# Patient Record
Sex: Male | Born: 1980
Health system: Southern US, Community
[De-identification: ages and names within clinical notes are randomized; demographics above are authoritative.]

---

## 2000-02-29 ENCOUNTER — Emergency Department (HOSPITAL_COMMUNITY): Admission: EM | Admit: 2000-02-29 | Discharge: 2000-02-29 | Payer: Self-pay | Admitting: Emergency Medicine

## 2000-10-03 ENCOUNTER — Encounter: Payer: Self-pay | Admitting: Emergency Medicine

## 2000-10-03 ENCOUNTER — Emergency Department (HOSPITAL_COMMUNITY): Admission: EM | Admit: 2000-10-03 | Discharge: 2000-10-03 | Payer: Self-pay | Admitting: Emergency Medicine

## 2008-08-20 ENCOUNTER — Emergency Department (HOSPITAL_COMMUNITY): Admission: EM | Admit: 2008-08-20 | Discharge: 2008-08-20 | Payer: Self-pay | Admitting: Emergency Medicine

## 2009-01-25 ENCOUNTER — Emergency Department (HOSPITAL_COMMUNITY): Admission: EM | Admit: 2009-01-25 | Discharge: 2009-01-25 | Payer: Self-pay | Admitting: Emergency Medicine

## 2010-01-29 ENCOUNTER — Emergency Department (HOSPITAL_COMMUNITY): Admission: EM | Admit: 2010-01-29 | Discharge: 2010-01-29 | Payer: Self-pay | Admitting: Emergency Medicine

## 2010-08-07 LAB — RPR: RPR Ser Ql: NONREACTIVE

## 2012-06-15 ENCOUNTER — Emergency Department (HOSPITAL_COMMUNITY)
Admission: EM | Admit: 2012-06-15 | Discharge: 2012-06-15 | Disposition: A | Discharge status: Home / Self Care | Payer: BC Managed Care – PPO | Attending: Emergency Medicine | Admitting: Emergency Medicine

## 2012-06-15 ENCOUNTER — Encounter (HOSPITAL_COMMUNITY): Payer: Self-pay | Admitting: Emergency Medicine

## 2012-06-15 DIAGNOSIS — R220 Localized swelling, mass and lump, head: Secondary | ICD-10-CM | POA: Insufficient documentation

## 2012-06-15 DIAGNOSIS — F17200 Nicotine dependence, unspecified, uncomplicated: Secondary | ICD-10-CM | POA: Insufficient documentation

## 2012-06-15 DIAGNOSIS — K089 Disorder of teeth and supporting structures, unspecified: Secondary | ICD-10-CM | POA: Insufficient documentation

## 2012-06-15 DIAGNOSIS — R221 Localized swelling, mass and lump, neck: Secondary | ICD-10-CM

## 2012-06-15 MED ORDER — CLINDAMYCIN HCL 150 MG PO CAPS: 300.0000 mg | ORAL_CAPSULE | Freq: Three times a day (TID) | ORAL | Status: DC

## 2012-06-15 MED ORDER — HYDROCODONE-ACETAMINOPHEN 5-500 MG PO TABS: 1.0000 | ORAL_TABLET | Freq: Four times a day (QID) | ORAL | Status: DC | PRN

## 2012-06-15 NOTE — ED Notes (Addendum)
Pt reports R jaw pain and swelling.  Denies any dental pain.  Pt states "i told her it's my gums."  No drainage noted.

## 2012-06-15 NOTE — ED Notes (Signed)
Pt states that he has a mass that has come up on his right jaw x 3 days ago.  States it is painful and has just gotten bigger.  States that he took amoxicillin that he had left over for the past 3 days.

## 2012-06-15 NOTE — ED Provider Notes (Signed)
History     CSN: [id number]  Arrival date & time 06/15/12  4098   First MD Initiated Contact with Patient 06/15/12 [phone]      Chief Complaint  Patient presents with  . Abscess    (Consider location/radiation/quality/duration/timing/severity/associated sxs/prior treatment) HPI Carlos Hatfield is a 32 y.o. male who presents to ED with complaint of pain and swelling to the right lower jaw. States started 2 days ago. States has bad teeth but they do not hurt. Has been taking amoxicillin for last 2 days, with no improvement. States took 1 tab 3 times a day. Has apt with dentist next month. No fever, chills, malaise.      History reviewed. No pertinent past medical history.  History reviewed. No pertinent past surgical history.  History reviewed. No pertinent family history.  History  Substance Use Topics  . Smoking status: Current Every Day Smoker -- 0.5 packs/day    Types: Cigarettes  . Smokeless tobacco: Not on file  . Alcohol Use: No      Review of Systems  Constitutional: Negative for fever and chills.  HENT: Positive for facial swelling and dental problem. Negative for congestion, sore throat, neck pain, neck stiffness and postnasal drip.   Respiratory: Negative.   Cardiovascular: Negative.   Gastrointestinal: Negative for vomiting.  Musculoskeletal: Negative for myalgias and arthralgias.  Neurological: Negative for headaches.    Allergies  Review of patient's allergies indicates no known allergies.  Home Medications  No current outpatient prescriptions on file.  BP 113/75  Pulse 98  Temp 99.3 F (37.4 C)  Resp 20  SpO2 100%  Physical Exam  Nursing note and vitals reviewed. Constitutional: He appears well-developed and well-nourished. No distress.  HENT:  Head: Normocephalic and atraumatic.  Right Ear: Tympanic membrane, external ear and ear canal normal.  Left Ear: Tympanic membrane, external ear and ear canal normal.  Nose: Nose normal.    Mouth/Throat: Uvula is midline, oropharynx is clear and moist and mucous membranes are normal. Abnormal dentition. Dental caries present. No dental abscesses or uvula swelling.       There is about 3x3cm swelling to the right submandibular area at the jaw line. Area is firm, round. Tender. No erythema.   Eyes: Conjunctivae normal are normal.  Neck: Neck supple.  Cardiovascular: Normal rate, regular rhythm and normal heart sounds.   Pulmonary/Chest: Effort normal and breath sounds normal. No respiratory distress. He has no wheezes. He has no rales.  Neurological: He is alert.  Skin: Skin is warm.    ED Course  Procedures (including critical care time)    1. Submandibular swelling       MDM  Pt with right facial mass/swelling. Differentials include dental abscess, submandibular glad stone, mass. Pt does have poor dentition, however no signs of an abscess on the dental exam. Abscess appears lower in the submandibular area. Pt has no signs of ludwig's angina or swelling under the tongue. He is in no distress. Will start on clindamycin, follow up with oral surgery and ENT.   Filed Vitals:   06/15/12 0905  BP: 113/75  Pulse: 98  Temp: 99.3 F (37.4 C)  Resp: [address] A Dannon Nguy[place]hi, [place] 06/15/12 1645

## 2012-06-16 NOTE — ED Provider Notes (Signed)
Medical screening examination/treatment/procedure(s) were performed by non-physician practitioner and as supervising physician I was immediately available for consultation/collaboration.   Gilda Crease, MD 06/16/12 [phone]

## 2012-09-01 ENCOUNTER — Emergency Department (INDEPENDENT_AMBULATORY_CARE_PROVIDER_SITE_OTHER)
Admission: EM | Admit: 2012-09-01 | Discharge: 2012-09-01 | Disposition: A | Discharge status: Home / Self Care | Payer: BC Managed Care – PPO | Source: Home / Self Care | Attending: Emergency Medicine | Admitting: Emergency Medicine

## 2012-09-01 ENCOUNTER — Emergency Department (INDEPENDENT_AMBULATORY_CARE_PROVIDER_SITE_OTHER): Payer: BC Managed Care – PPO

## 2012-09-01 ENCOUNTER — Encounter (HOSPITAL_COMMUNITY): Payer: Self-pay | Admitting: *Deleted

## 2012-09-01 DIAGNOSIS — IMO0002 Reserved for concepts with insufficient information to code with codable children: Secondary | ICD-10-CM

## 2012-09-01 DIAGNOSIS — S46911A Strain of unspecified muscle, fascia and tendon at shoulder and upper arm level, right arm, initial encounter: Secondary | ICD-10-CM

## 2012-09-01 MED ORDER — KETOROLAC TROMETHAMINE 60 MG/2ML IM SOLN
INTRAMUSCULAR | Status: AC
  Filled 2012-09-01: qty 2

## 2012-09-01 MED ORDER — CYCLOBENZAPRINE HCL 10 MG PO TABS: 10.0000 mg | ORAL_TABLET | Freq: Three times a day (TID) | ORAL | Status: DC | PRN

## 2012-09-01 MED ORDER — IBUPROFEN 600 MG PO TABS: 600.0000 mg | ORAL_TABLET | Freq: Four times a day (QID) | ORAL | Status: DC | PRN

## 2012-09-01 MED ORDER — KETOROLAC TROMETHAMINE 60 MG/2ML IM SOLN
60.0000 mg | Freq: Once | INTRAMUSCULAR | Status: AC
  Administered 2012-09-01: 60 mg via INTRAMUSCULAR

## 2012-09-01 MED ORDER — HYDROCODONE-ACETAMINOPHEN 5-325 MG PO TABS: 1.0000 | ORAL_TABLET | ORAL | Status: DC | PRN

## 2012-09-01 NOTE — ED Provider Notes (Signed)
History     CSN: [id number]  Arrival date & time 09/01/12  1723   None     Chief Complaint  Patient presents with  . Arm Pain    (Consider location/radiation/quality/duration/timing/severity/associated sxs/prior treatment) Patient is a 32 y.o. male presenting with arm pain.  Arm Pain  C/O RIGHT SHOULDER PAIN RADIATING TO THE ARM AND FOREARM X 1 WEEK COULD HAVE BEEN HURT LIFTING AT WORK  History reviewed. No pertinent past medical history.  History reviewed. No pertinent past surgical history.  History reviewed. No pertinent family history.  History  Substance Use Topics  . Smoking status: Current Every Day Smoker -- 0.50 packs/day    Types: Cigarettes  . Smokeless tobacco: Not on file  . Alcohol Use: Yes     Comment: once every 2 weeks      Review of Systems  Musculoskeletal:       C/O RIGHT SHOULDER PAIN RADIATING TO THE ARM  All other systems reviewed and are negative.    Allergies  Review of patient's allergies indicates no known allergies.  Home Medications   Current Outpatient Rx  Name  Route  Sig  Dispense  Refill  . clindamycin (CLEOCIN) 150 MG capsule   Oral   Take 2 capsules (300 mg total) by mouth 3 (three) times daily.   42 capsule   0   . cyclobenzaprine (FLEXERIL) 10 MG tablet   Oral   Take 1 tablet (10 mg total) by mouth 3 (three) times daily as needed for muscle spasms.   30 tablet   0   . HYDROcodone-acetaminophen (NORCO/VICODIN) 5-325 MG per tablet   Oral   Take 1 tablet by mouth every 4 (four) hours as needed for pain.   15 tablet   0   . HYDROcodone-acetaminophen (VICODIN) 5-500 MG per tablet   Oral   Take 1-2 tablets by mouth every 6 (six) hours as needed for pain.   15 tablet   0   . ibuprofen (ADVIL,MOTRIN) 600 MG tablet   Oral   Take 1 tablet (600 mg total) by mouth every 6 (six) hours as needed for pain.   30 tablet   0     BP 124/70  Pulse 63  Temp(Src) 98.6 F (37 C) (Oral)  Resp 16  SpO2  100%  Physical Exam  Constitutional: He appears well-nourished.  Neck: Normal range of motion. Neck supple.  Cardiovascular: Normal rate and regular rhythm.   Pulmonary/Chest: Effort normal and breath sounds normal.  Abdominal: Soft.  Musculoskeletal: He exhibits tenderness.  RIGHT SHOULDER=NO SWELLING OR DEFORMITY BUT TENDER ANTERIORLY; ROM LIMITED DUE TO PAIN REST OF UPPER EXTREMITY WITH NO SELLING OR TENDERNESS NO NEUROVASCULAR DEFICIT  Skin: Skin is warm, dry and intact.  Psychiatric: He has a normal mood and affect. His speech is normal and behavior is normal.    ED Course  Procedures (including critical care time)  Labs Reviewed - No data to display No results found.   1. Shoulder strain, right, initial encounter       MDM          Jani Files, MD 09/21/12 1059

## 2012-09-01 NOTE — ED Notes (Signed)
C/o severe pain in R arm onset 1 week ago. No known injury.  Pain has gotten progressively worse.  Tingling in R thumb and index finger and all the way up medial aspect of R arm.  Has pain in top of R shoulder.  Pain worse when wakes up.

## 2014-09-21 ENCOUNTER — Emergency Department (HOSPITAL_COMMUNITY): Payer: BLUE CROSS/BLUE SHIELD

## 2014-09-21 ENCOUNTER — Emergency Department (HOSPITAL_COMMUNITY)
Admission: EM | Admit: 2014-09-21 | Discharge: 2014-09-21 | Disposition: A | Discharge status: Home / Self Care | Payer: BLUE CROSS/BLUE SHIELD | Attending: Emergency Medicine | Admitting: Emergency Medicine

## 2014-09-21 ENCOUNTER — Encounter (HOSPITAL_COMMUNITY): Payer: Self-pay | Admitting: Family Medicine

## 2014-09-21 DIAGNOSIS — R05 Cough: Secondary | ICD-10-CM | POA: Diagnosis present

## 2014-09-21 DIAGNOSIS — Z792 Long term (current) use of antibiotics: Secondary | ICD-10-CM | POA: Diagnosis not present

## 2014-09-21 DIAGNOSIS — R079 Chest pain, unspecified: Secondary | ICD-10-CM | POA: Diagnosis not present

## 2014-09-21 DIAGNOSIS — Z720 Tobacco use: Secondary | ICD-10-CM | POA: Diagnosis not present

## 2014-09-21 DIAGNOSIS — R059 Cough, unspecified: Secondary | ICD-10-CM

## 2014-09-21 DIAGNOSIS — R0602 Shortness of breath: Secondary | ICD-10-CM

## 2014-09-21 MED ORDER — TRAMADOL HCL 50 MG PO TABS: 50.0000 mg | ORAL_TABLET | Freq: Four times a day (QID) | ORAL | Status: DC | PRN

## 2014-09-21 MED ORDER — KETOROLAC TROMETHAMINE 30 MG/ML IJ SOLN
30.0000 mg | Freq: Once | INTRAMUSCULAR | Status: AC
  Administered 2014-09-21: 30 mg via INTRAVENOUS
  Filled 2014-09-21: qty 1

## 2014-09-21 MED ORDER — ALBUTEROL SULFATE HFA 108 (90 BASE) MCG/ACT IN AERS: 1.0000 | INHALATION_SPRAY | Freq: Four times a day (QID) | RESPIRATORY_TRACT | Status: AC | PRN

## 2014-09-21 MED ORDER — KETOROLAC TROMETHAMINE 30 MG/ML IM SOLN: 30.0000 mg | Freq: Once | INTRAMUSCULAR | Status: DC

## 2014-09-21 MED ORDER — PREDNISONE 10 MG PO TABS: ORAL_TABLET | ORAL | Status: DC

## 2014-09-21 NOTE — Discharge Instructions (Signed)
Cough, Adult  A cough is a reflex. It helps you clear your throat and airways. A cough can help heal your body. A cough can last 2 or 3 weeks (acute) or may last more than 8 weeks (chronic). Some common causes of a cough can include an infection, allergy, or a cold. HOME CARE  Only take medicine as told by your doctor.  If given, take your medicines (antibiotics) as told. Finish them even if you start to feel better.  Use a cold steam vaporizer or humidifier in your home. This can help loosen thick spit (secretions).  Sleep so you are almost sitting up (semi-upright). Use pillows to do this. This helps reduce coughing.  Rest as needed.  Stop smoking if you smoke. GET HELP RIGHT AWAY IF:  You have yellowish-white fluid (pus) in your thick spit.  Your cough gets worse.  Your medicine does not reduce coughing, and you are losing sleep.  You cough up blood.  You have trouble breathing.  Your pain gets worse and medicine does not help.  You have a fever. MAKE SURE YOU:   Understand these instructions.  Will watch your condition.  Will get help right away if you are not doing well or get worse. Document Released: 01/22/2011 Document Revised: 09/25/2013 Document Reviewed: 01/22/2011 [facility] Patient Information 2015 [place], [place]. This information is not intended to replace advice given to you by your health care provider. Make sure you discuss any questions you have with your health care provider. Chest Wall Pain Chest wall pain is pain in or around the bones and muscles of your chest. It may take up to 6 weeks to get better. It may take longer if you must stay physically active in your work and activities.  CAUSES  Chest wall pain may happen on its own. However, it may be caused by:  A viral illness like the flu.  Injury.  Coughing.  Exercise.  Arthritis.  Fibromyalgia.  Shingles. HOME CARE INSTRUCTIONS   Avoid overtiring physical activity. Try not to strain or  perform activities that cause pain. This includes any activities using your chest or your abdominal and side muscles, especially if heavy weights are used.  Put ice on the sore area.  Put ice in a plastic bag.  Place a towel between your skin and the bag.  Leave the ice on for 15-20 minutes per hour while awake for the first 2 days.  Only take over-the-counter or prescription medicines for pain, discomfort, or fever as directed by your caregiver. SEEK IMMEDIATE MEDICAL CARE IF:   Your pain increases, or you are very uncomfortable.  You have a fever.  Your chest pain becomes worse.  You have new, unexplained symptoms.  You have nausea or vomiting.  You feel sweaty or lightheaded.  You have a cough with phlegm (sputum), or you cough up blood. MAKE SURE YOU:   Understand these instructions.  Will watch your condition.  Will get help right away if you are not doing well or get worse. Document Released: 05/11/2005 Document Revised: 08/03/2011 Document Reviewed: 01/05/2011 [facility] Patient Information 2015 [place], [place]. This information is not intended to replace advice given to you by your health care provider. Make sure you discuss any questions you have with your health care provider.

## 2014-09-21 NOTE — ED Notes (Signed)
Pt here for cough and right sided chest pain with coughing x 2 months. sts has been treated with abx and no relief.

## 2014-09-21 NOTE — ED Provider Notes (Signed)
CSN: [id number]     Arrival date & time 09/21/14  1103 History   First MD Initiated Contact with Patient 09/21/14 1147     Chief Complaint  Patient presents with  . Cough  . Chest Pain     (Consider location/radiation/quality/duration/timing/severity/associated sxs/prior Treatment) Patient is a 34 y.o. male presenting with cough. The history is provided by the patient. No language interpreter was used.  Cough Cough characteristics:  Non-productive Severity:  Moderate Onset quality:  Gradual Duration:  8 weeks Timing:  Constant Progression:  Worsening Chronicity:  New Smoker: no   Context: not sick contacts   Relieved by:  Nothing Worsened by:  Nothing tried Ineffective treatments:  None tried Associated symptoms: chest pain   Associated symptoms: no shortness of breath   Pt is worried that he coughed so hard that he broke a rib.    History reviewed. No pertinent past medical history. History reviewed. No pertinent past surgical history. History reviewed. No pertinent family history. History  Substance Use Topics  . Smoking status: Current Every Day Smoker -- 0.50 packs/day    Types: Cigarettes  . Smokeless tobacco: Not on file  . Alcohol Use: Yes     Comment: once every 2 weeks    Review of Systems  Respiratory: Positive for cough. Negative for shortness of breath.   Cardiovascular: Positive for chest pain.  All other systems reviewed and are negative.     Allergies  Review of patient's allergies indicates no known allergies.  Home Medications   Prior to Admission medications   Medication Sig Start Date End Date Taking? Authorizing Provider  clindamycin (CLEOCIN) 150 MG capsule Take 2 capsules (300 mg total) by mouth 3 (three) times daily. 06/15/12   Tatyana Kirichenko, PA-C  cyclobenzaprine (FLEXERIL) 10 MG tablet Take 1 tablet (10 mg total) by mouth 3 (three) times daily as needed for muscle spasms. 09/01/12   Jani Files, MD   HYDROcodone-acetaminophen (NORCO/VICODIN) 5-325 MG per tablet Take 1 tablet by mouth every 4 (four) hours as needed for pain. 09/01/12   Jani Files, MD  HYDROcodone-acetaminophen (VICODIN) 5-500 MG per tablet Take 1-2 tablets by mouth every 6 (six) hours as needed for pain. 06/15/12   Tatyana Kirichenko, PA-C  ibuprofen (ADVIL,MOTRIN) 600 MG tablet Take 1 tablet (600 mg total) by mouth every 6 (six) hours as needed for pain. 09/01/12   Duwayne Heck de [place], MD   BP 109/58 mmHg  Pulse 74  Temp(Src) 97.7 F (36.5 C)  Resp 17  Ht  (1.93 m)  Wt 185 lb (83.915 kg)  BMI 22.53 kg/m2  SpO2 96% Physical Exam  Constitutional: He appears well-developed and well-nourished.  HENT:  Head: Normocephalic.  Right Ear: External ear normal.  Left Ear: External ear normal.  Nose: Nose normal.  Mouth/Throat: Oropharynx is clear and moist.  Eyes: Conjunctivae are normal. Pupils are equal, round, and reactive to light.  Neck: Normal range of motion. Neck supple.  Cardiovascular: Normal rate.   Pulmonary/Chest: Effort normal.  Abdominal: Soft.  Musculoskeletal: Normal range of motion.  Neurological: He is alert.  Skin: Skin is warm.  Psychiatric: He has a normal mood and affect.  Nursing note and vitals reviewed.   ED Course  Procedures (including critical care time) Labs Review Labs Reviewed - No data to display  Imaging Review Dg Ribs Unilateral W/chest Right  09/21/2014   CLINICAL DATA:  Cough for 2 months. Right anterior and mid chest  pain.  EXAM: RIGHT RIBS AND CHEST - 3+ VIEW  COMPARISON:  None.  FINDINGS: No fracture or other bone lesions are seen involving the ribs. There is no evidence of pneumothorax or pleural effusion. Both lungs are clear. Heart size and mediastinal contours are within normal limits.  IMPRESSION: Negative.   Electronically Signed   By: Charlett NoseKevin  Dover M.D.   On: 09/21/2014 12:06     EKG Interpretation   Date/Time:  Friday September 21 2014 11:29:29  EDT Ventricular Rate:  56 PR Interval:  146 QRS Duration: 99 QT Interval:  389 QTC Calculation: 375 R Axis:   96 Text Interpretation:  Sinus rhythm Borderline right axis deviation ST  elev, probable normal early repol pattern Baseline wander in lead(s) V3 V4  V5 V6 No old tracing to compare Confirmed by [facility] CenterWENTZ  MD, ELLIOTT [phone] on  09/21/2014 11:50:25 AM      MDM  Pt has been on antibiotics x 2 Pt counseled on cough.   Pt is given rx for prednisone, albuterol and tramadol.   Pt is advised to follow up with his MD for recheck.   Final diagnoses:  Cough        Lonia SkinnerLeslie K ShanksvilleSofia, PA-C 09/21/14 1458  Mancel BaleElliott Wentz, MD 09/21/14 1620

## 2015-05-14 ENCOUNTER — Emergency Department (INDEPENDENT_AMBULATORY_CARE_PROVIDER_SITE_OTHER)
Admission: EM | Admit: 2015-05-14 | Discharge: 2015-05-14 | Disposition: A | Discharge status: Home / Self Care | Payer: BLUE CROSS/BLUE SHIELD | Source: Home / Self Care | Attending: Family Medicine | Admitting: Family Medicine

## 2015-05-14 ENCOUNTER — Encounter (HOSPITAL_COMMUNITY): Payer: Self-pay | Admitting: *Deleted

## 2015-05-14 DIAGNOSIS — M67972 Unspecified disorder of synovium and tendon, left ankle and foot: Secondary | ICD-10-CM

## 2015-05-14 NOTE — Discharge Instructions (Signed)
Wear boot at all times except bed, see orthopedist as soon as possible

## 2015-05-14 NOTE — ED Provider Notes (Signed)
CSN: [id number]     Arrival date & time 05/14/15  1819 History   First MD Initiated Contact with Patient 05/14/15 1935     Chief Complaint  Patient presents with  . Ankle Pain   (Consider location/radiation/quality/duration/timing/severity/associated sxs/prior Treatment) Patient is a 34 y.o. male presenting with ankle pain. The history is provided by the patient.  Ankle Pain Location:  Ankle Time since incident:  1 month Injury: yes   Mechanism of injury comment:  Ankle stepped on with cleat playing football over Thanksgiving, continues tender and swollen. Ankle location:  L ankle Pain details:    Quality:  Sharp   Severity:  Moderate   Onset quality:  Sudden   History reviewed. No pertinent past medical history. History reviewed. No pertinent past surgical history. History reviewed. No pertinent family history. Social History  Substance Use Topics  . Smoking status: Current Every Day Smoker -- 0.50 packs/day    Types: Cigarettes  . Smokeless tobacco: None  . Alcohol Use: Yes     Comment: once every 2 weeks    Review of Systems  Constitutional: Negative.   Musculoskeletal: Positive for joint swelling and gait problem.  Skin: Negative.   All other systems reviewed and are negative.   Allergies  Review of patient's allergies indicates no known allergies.  Home Medications   Prior to Admission medications   Medication Sig Start Date End Date Taking? Authorizing Provider  albuterol (PROVENTIL HFA;VENTOLIN HFA) 108 (90 BASE) MCG/ACT inhaler Inhale 1-2 puffs into the lungs every 6 (six) hours as needed for wheezing or shortness of breath. 09/21/14   Elson Areas, PA-C  clindamycin (CLEOCIN) 150 MG capsule Take 2 capsules (300 mg total) by mouth 3 (three) times daily. Patient not taking: Reported on 09/21/2014 06/15/12   Tatyana Kirichenko, PA-C  cyclobenzaprine (FLEXERIL) 10 MG tablet Take 1 tablet (10 mg total) by mouth 3 (three) times daily as needed for muscle  spasms. Patient not taking: Reported on 09/21/2014 09/01/12   Duwayne Heck de Marcello Moores, MD  HYDROcodone-acetaminophen (NORCO/VICODIN) 5-325 MG per tablet Take 1 tablet by mouth every 4 (four) hours as needed for pain. Patient not taking: Reported on 09/21/2014 09/01/12   Duwayne Heck de Marcello Moores, MD  HYDROcodone-acetaminophen (VICODIN) 5-500 MG per tablet Take 1-2 tablets by mouth every 6 (six) hours as needed for pain. Patient not taking: Reported on 09/21/2014 06/15/12   Tatyana Kirichenko, PA-C  ibuprofen (ADVIL,MOTRIN) 200 MG tablet Take 800 mg by mouth every 6 (six) hours as needed for mild pain or moderate pain.    Historical Provider, MD  ibuprofen (ADVIL,MOTRIN) 600 MG tablet Take 1 tablet (600 mg total) by mouth every 6 (six) hours as needed for pain. Patient not taking: Reported on 09/21/2014 09/01/12   Duwayne Heck de [place], MD  predniSONE (DELTASONE) 10 MG tablet [phone] taper 09/21/14   Elson Areas, PA-C  traMADol (ULTRAM) 50 MG tablet Take 1 tablet (50 mg total) by mouth every 6 (six) hours as needed. 09/21/14   Elson Areas, PA-C   Meds Ordered and Administered this Visit  Medications - No data to display  BP 115/78 mmHg  Pulse 80  Temp(Src) 98.2 F (36.8 C) (Oral)  Resp 14  SpO2 98% No data found.   Physical Exam  Constitutional: He is oriented to person, place, and time. He appears well-developed and well-nourished.  Musculoskeletal: He exhibits tenderness.       Left ankle: Achilles tendon exhibits pain. Achilles tendon  exhibits no defect and normal Thompson's test results.       Feet:  Neurological: He is alert and oriented to person, place, and time.  Skin: Skin is warm and dry.  Nursing note and vitals reviewed.   ED Course  Procedures (including critical care time)  Labs Review Labs Reviewed - No data to display  Imaging Review No results found.   Visual Acuity Review  Right Eye Distance:   Left Eye Distance:   Bilateral Distance:    Right Eye Near:    Left Eye Near:    Bilateral Near:         MDM   1. Achilles tendon disorder, left        Linna HoffJames D Genette Huertas, MD 05/14/15 2006

## 2015-05-14 NOTE — ED Notes (Signed)
Pt  Reports    l   Ankle   -     The        Pt  Has  Some  Knots  In  The  Heel of  The  l  Foot     He  States          He     Has      A  Knot  On the  Back  Of      His  Achillis  Heel        He  Reports             He  Injured  The   Heel   3  Weeks  Ago  whils  Playing  Sports  He  Reports  Pain on  Weight  Bearing

## 2016-10-12 ENCOUNTER — Emergency Department (HOSPITAL_COMMUNITY)
Admission: EM | Admit: 2016-10-12 | Discharge: 2016-10-13 | Disposition: A | Discharge status: Home / Self Care | Payer: BLUE CROSS/BLUE SHIELD | Attending: Emergency Medicine | Admitting: Emergency Medicine

## 2016-10-12 ENCOUNTER — Encounter (HOSPITAL_COMMUNITY): Payer: Self-pay | Admitting: Emergency Medicine

## 2016-10-12 ENCOUNTER — Emergency Department (HOSPITAL_COMMUNITY): Payer: BLUE CROSS/BLUE SHIELD

## 2016-10-12 DIAGNOSIS — M542 Cervicalgia: Secondary | ICD-10-CM | POA: Diagnosis not present

## 2016-10-12 DIAGNOSIS — K0889 Other specified disorders of teeth and supporting structures: Secondary | ICD-10-CM | POA: Diagnosis present

## 2016-10-12 DIAGNOSIS — F17210 Nicotine dependence, cigarettes, uncomplicated: Secondary | ICD-10-CM | POA: Diagnosis not present

## 2016-10-12 DIAGNOSIS — Z79899 Other long term (current) drug therapy: Secondary | ICD-10-CM | POA: Diagnosis not present

## 2016-10-12 DIAGNOSIS — K029 Dental caries, unspecified: Secondary | ICD-10-CM | POA: Diagnosis not present

## 2016-10-12 DIAGNOSIS — K047 Periapical abscess without sinus: Secondary | ICD-10-CM

## 2016-10-12 LAB — CBC
HCT: 41.3 % (ref 39.0–52.0)
HEMOGLOBIN: 13.8 g/dL (ref 13.0–17.0)
MCH: 31.4 pg (ref 26.0–34.0)
MCHC: 33.4 g/dL (ref 30.0–36.0)
MCV: 94.1 fL (ref 78.0–100.0)
Platelets: 287 10*3/uL (ref 150–400)
RBC: 4.39 MIL/uL (ref 4.22–5.81)
RDW: 13.3 % (ref 11.5–15.5)
WBC: 8.4 10*3/uL (ref 4.0–10.5)

## 2016-10-12 LAB — BASIC METABOLIC PANEL
ANION GAP: 7 (ref 5–15)
BUN: 9 mg/dL (ref 6–20)
CHLORIDE: 106 mmol/L (ref 101–111)
CO2: 26 mmol/L (ref 22–32)
Calcium: 9.3 mg/dL (ref 8.9–10.3)
Creatinine, Ser: 0.99 mg/dL (ref 0.61–1.24)
GFR calc non Af Amer: >60 mL/min (ref >60)
Glucose, Bld: 94 mg/dL (ref 65–99)
Potassium: 3.7 mmol/L (ref 3.5–5.1)
Sodium: 139 mmol/L (ref 135–145)

## 2016-10-12 LAB — I-STAT TROPONIN, ED: TROPONIN I, POC: 0 ng/mL (ref 0.00–0.08)

## 2016-10-12 MED ORDER — METHOCARBAMOL 500 MG PO TABS
750.0000 mg | ORAL_TABLET | Freq: Once | ORAL | Status: AC
  Administered 2016-10-13: 750 mg via ORAL
  Filled 2016-10-12: qty 2

## 2016-10-12 MED ORDER — KETOROLAC TROMETHAMINE 60 MG/2ML IM SOLN
30.0000 mg | Freq: Once | INTRAMUSCULAR | Status: AC
  Administered 2016-10-13: 30 mg via INTRAMUSCULAR
  Filled 2016-10-12: qty 2

## 2016-10-12 NOTE — ED Provider Notes (Signed)
[facility]-EMERGENCY DEPT Provider Note   CSN: [id number] Arrival date & time: 10/12/16  1828     History   Chief Complaint Chief Complaint  Patient presents with  . Chest Pain  . Dental Pain    HPI Carlos Hatfield is a 36 y.o. male.  HPI Carlos Hatfield is a 36 y.o. male presents to emergency department complaining of dental pain and neck pain radiating to the chest. Patient with no prior medical problems. He reports pain in the left lower back tooth for approximately a week. He states it has gotten worse the last 2 days. He states pain radiates into his neck and into the left upper chest. States neck is painful to move. He also reports tingling sensation in left hand. He has been taking ibuprofen for pain with no relief. He denies any prior neck injuries. He denies any similar symptoms in the past. He does have a dentist but has not made an appointment with them yet. Denies any fever or chills. No facial or neck swelling. No shortness of breath.  History reviewed. No pertinent past medical history.  There are no active problems to display for this patient.   History reviewed. No pertinent surgical history.     Home Medications    Prior to Admission medications   Medication Sig Start Date End Date Taking? Authorizing Provider  albuterol (PROVENTIL HFA;VENTOLIN HFA) 108 (90 BASE) MCG/ACT inhaler Inhale 1-2 puffs into the lungs every 6 (six) hours as needed for wheezing or shortness of breath. 09/21/14   Elson AreasSofia, Leslie K, PA-C  clindamycin (CLEOCIN) 150 MG capsule Take 2 capsules (300 mg total) by mouth 3 (three) times daily. Patient not taking: Reported on 09/21/2014 06/15/12   Jaynie CrumbleKirichenko, Coralee Edberg, PA-C  cyclobenzaprine (FLEXERIL) 10 MG tablet Take 1 tablet (10 mg total) by mouth 3 (three) times daily as needed for muscle spasms. Patient not taking: Reported on 09/21/2014 09/01/12   [place], Duwayne Heckeuben M, MD  HYDROcodone-acetaminophen (NORCO/VICODIN) 5-325 MG per tablet Take 1 tablet by  mouth every 4 (four) hours as needed for pain. Patient not taking: Reported on 09/21/2014 09/01/12   [place], Duwayne Heckeuben M, MD  HYDROcodone-acetaminophen (VICODIN) 5-500 MG per tablet Take 1-2 tablets by mouth every 6 (six) hours as needed for pain. Patient not taking: Reported on 09/21/2014 06/15/12   Jaynie CrumbleKirichenko, Staci Carver, PA-C  ibuprofen (ADVIL,MOTRIN) 200 MG tablet Take 800 mg by mouth every 6 (six) hours as needed for mild pain or moderate pain.    [provider]  ibuprofen (ADVIL,MOTRIN) 600 MG tablet Take 1 tablet (600 mg total) by mouth every 6 (six) hours as needed for pain. Patient not taking: Reported on 09/21/2014 09/01/12   [address]Las [address] ShoalsAl[place], Duwayne Heckeuben M, MD  predniSONE (DELTASONE) 10 MG tablet 4,0,9,8,1,16,5,4,3,2,1 taper 09/21/14   Elson AreasSofia, Leslie K, PA-C  traMADol (ULTRAM) 50 MG tablet Take 1 tablet (50 mg total) by mouth every 6 (six) hours as needed. 09/21/14   Elson AreasSofia, Leslie K, PA-C    Family History History reviewed. No pertinent family history.  Social History Social History  Substance Use Topics  . Smoking status: Current Every Day Smoker    Packs/day: 0.50    Types: Cigarettes  . Smokeless tobacco: Not on file  . Alcohol use Yes     Comment: once every 2 weeks     Allergies   Patient has no known allergies.   Review of Systems Review of Systems  Constitutional: Negative for chills and fever.  HENT: Positive  for dental problem. Negative for sore throat and trouble swallowing.   Respiratory: Positive for chest tightness. Negative for cough and shortness of breath.   Cardiovascular: Positive for chest pain. Negative for palpitations and leg swelling.  Gastrointestinal: Negative for abdominal distention, abdominal pain, diarrhea, nausea and vomiting.  Genitourinary: Negative for dysuria, frequency, hematuria and urgency.  Musculoskeletal: Positive for neck pain and neck stiffness. Negative for arthralgias and myalgias.  Skin: Negative for rash.  Allergic/Immunologic: Negative for  immunocompromised state.  Neurological: Negative for dizziness, weakness, light-headedness, numbness and headaches.  All other systems reviewed and are negative.    Physical Exam Updated Vital Signs BP 122/90 (BP Location: Left Arm) Comment: Simultaneous filing. User may not have seen previous data.  Pulse (!) 50   Temp 98.3 F (36.8 C) (Oral)   Resp (!) 9   Ht 6\' 4"  (1.93 m)   Wt 79.4 kg (175 lb)   SpO2 97%   BMI 21.30 kg/m   Physical Exam  Constitutional: He is oriented to person, place, and time. He appears well-developed and well-nourished. No distress.  HENT:  Head: Normocephalic and atraumatic.  Left lower second molar with large cavity. No significant surrounding gum swelling. No definite abscess. No facial swelling. No trismus. No swelling under the tongue.  Eyes: Conjunctivae are normal.  Neck: Neck supple.  Tenderness to palpation over left trapezius muscle and sternocleidomastoid. Pain with range of motion of the neck. No tenderness over the right side. No midline tenderness.  Cardiovascular: Normal rate, regular rhythm and normal heart sounds.   Pulmonary/Chest: Effort normal. No respiratory distress. He has no wheezes. He has no rales. He exhibits no tenderness.  Abdominal: Soft. Bowel sounds are normal. He exhibits no distension. There is no tenderness. There is no rebound.  Musculoskeletal: He exhibits no edema.  Full range of motion of the left arm, left grip strength is 5 over 5 and equal bilaterally. Distal radial pulses intact. Sensation intact in her left extremity in all dermatomes. Patient is able to do "thumbs up," spread all his fingers, across his second and third fingers.  Neurological: He is alert and oriented to person, place, and time.  Skin: Skin is warm and dry.  Nursing note and vitals reviewed.    ED Treatments / Results  Labs (all labs ordered are listed, but only abnormal results are displayed) Labs Reviewed  BASIC METABOLIC PANEL  CBC    I-STAT TROPOININ, ED    EKG  EKG Interpretation  Date/Time:  Monday Oct 12 2016 19:22:24 EDT Ventricular Rate:  53 PR Interval:  144 QRS Duration: 98 QT Interval:  400 QTC Calculation: 375 R Axis:   91 Text Interpretation:  Sinus bradycardia Rightward axis ST elevation, consider early repolarization, pericarditis, or injury Abnormal ECG No significant change since last tracing Confirmed by Lincoln Brigham [phone]) on 10/12/2016 11:11:16 PM       Radiology Dg Chest 2 View  Result Date: 10/12/2016 CLINICAL DATA:  Chest pain EXAM: CHEST  2 VIEW COMPARISON:  09/21/2014 FINDINGS: There are 2 nodular densities in the projection of both upper lobes which were not identified on the previous exam. Normal heart size. No pleural effusion or edema. No airspace opacities. IMPRESSION: 1. No acute cardiopulmonary abnormalities. 2. There are 2 nodular densities overlying both upper lobes which may be external to the patient, perhaps representing buttons/snaps. Recommend removal of any external clothing which may be creating artifact and repeat chest radiograph. These results will be called to the ordering clinician  or representative by the Radiologist Assistant, and communication documented in the PACS or zVision Dashboard. Electronically Signed   By: Signa Kell M.D.   On: 10/12/2016 19:55    Procedures Procedures (including critical care time)  Medications Ordered in ED Medications  ketorolac (TORADOL) injection 30 mg (not administered)  methocarbamol (ROBAXIN) tablet 750 mg (not administered)     Initial Impression / Assessment and Plan / ED Course  I have reviewed the triage vital signs and the nursing notes.  Pertinent labs & imaging results that were available during my care of the patient were reviewed by me and considered in my medical decision making (see chart for details).     Patient in emergency department with dental pain, left-sided neck pain, tingling in the left hand, chest  pain. Symptoms have been constant for greater than 2 days, atypical for coronary artery disease. Patient has no risk factors. His vital signs are normal, he is PERC negative. Labs obtained in triage including troponin, BMP, CBC all within normal. Chest x-ray showing Densities over bilateral upper chest which are thought to be external. We will repeat chest x-ray.  12:30 AM Chest x-ray repeat is negative. I have low suspicion for coronary disease or blood clots. His lungs are clear. I believe that his neck pain is musculoskeletal. He does have a large caries in the left lower second molar which I will treat with penicillin for possible early infection and dentist follow-up. I will start him on Robaxin and naproxen for his neck pain. I will have him follow-up with family doctor for recheck. Return precautions discussed. Patient stable for discharge home  Vitals:   10/12/16 1922 10/12/16 2056 10/12/16 2302 10/12/16 2303  BP:  113/77 122/90   Pulse:  (!) 53 (!) 48 (!) 50  Resp:  16 12 (!) 9  Temp:   98.3 F (36.8 C)   TempSrc:   Oral   SpO2:  100% 98% 97%  Weight: 79.4 kg (175 lb)     Height: 6\' 4"  (1.93 m)        Final Clinical Impressions(s) / ED Diagnoses   Final diagnoses:  Neck pain  Infected dental carries    New Prescriptions New Prescriptions   METHOCARBAMOL (ROBAXIN) 500 MG TABLET    Take 1 tablet (500 mg total) by mouth 2 (two) times daily.   NAPROXEN (NAPROSYN) 500 MG TABLET    Take 1 tablet (500 mg total) by mouth 2 (two) times daily.   PENICILLIN V POTASSIUM (VEETID) 500 MG TABLET    Take 1 tablet (500 mg total) by mouth 4 (four) times daily.     Jaynie Crumble, PA-C 10/13/16 0036    Tilden Fossa, MD 10/16/16 [phone]

## 2016-10-12 NOTE — ED Triage Notes (Signed)
Pt having toothache for the past few days got worse today now is having 8/10 left side cp radiating to his left arm, feeling dizzy and week, with nausea not vomiting.

## 2016-10-13 ENCOUNTER — Emergency Department (HOSPITAL_COMMUNITY): Payer: BLUE CROSS/BLUE SHIELD

## 2016-10-13 MED ORDER — PENICILLIN V POTASSIUM 500 MG PO TABS: 500.0000 mg | ORAL_TABLET | Freq: Four times a day (QID) | ORAL | 0 refills | Status: AC

## 2016-10-13 MED ORDER — METHOCARBAMOL 500 MG PO TABS: 500.0000 mg | ORAL_TABLET | Freq: Two times a day (BID) | ORAL | 0 refills | Status: DC

## 2016-10-13 MED ORDER — NAPROXEN 500 MG PO TABS: 500.0000 mg | ORAL_TABLET | Freq: Two times a day (BID) | ORAL | 0 refills | Status: DC

## 2016-10-13 MED ORDER — ONDANSETRON 4 MG PO TBDP
4.0000 mg | ORAL_TABLET | Freq: Once | ORAL | Status: AC
  Administered 2016-10-13: 4 mg via ORAL
  Filled 2016-10-13: qty 1

## 2016-10-13 NOTE — ED Notes (Signed)
Patient transported to X-ray 

## 2016-10-13 NOTE — Discharge Instructions (Signed)
Take naproxen for pain as prescribed. Take Robaxin for muscle spasms. Take penicillin as prescribed until all gone for infection in your tooth. Please follow-up with a dentist and family doctor for recheck on further evaluation and treatment if symptoms do not improve.

## 2019-02-25 IMAGING — CR DG CHEST 2V
2 series · 2 of 2 positions shown · non-contrast
Comparison: 09/21/2014

CLINICAL DATA: Chest pain

EXAM:
CHEST  2 VIEW

[chest pa]
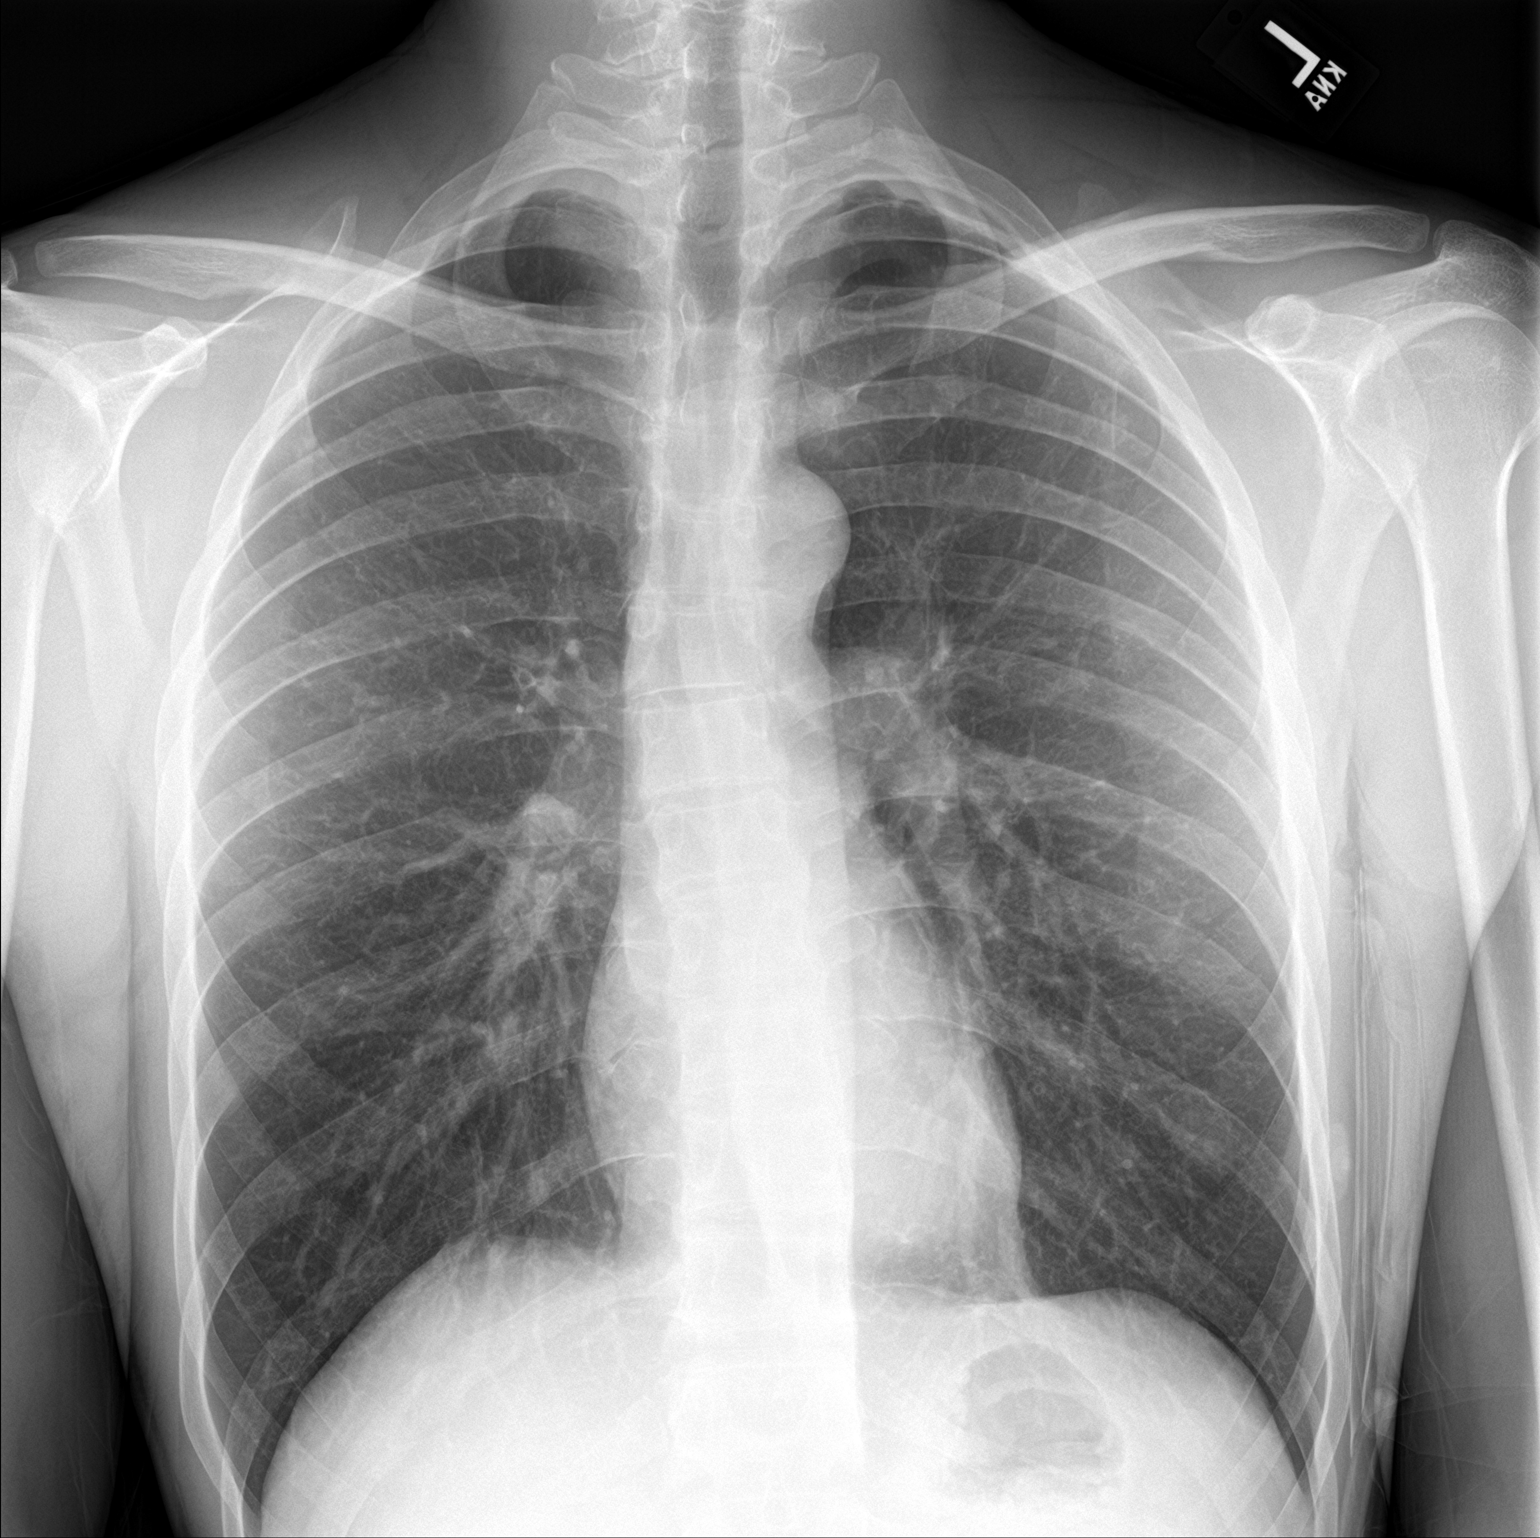

[chest lat]
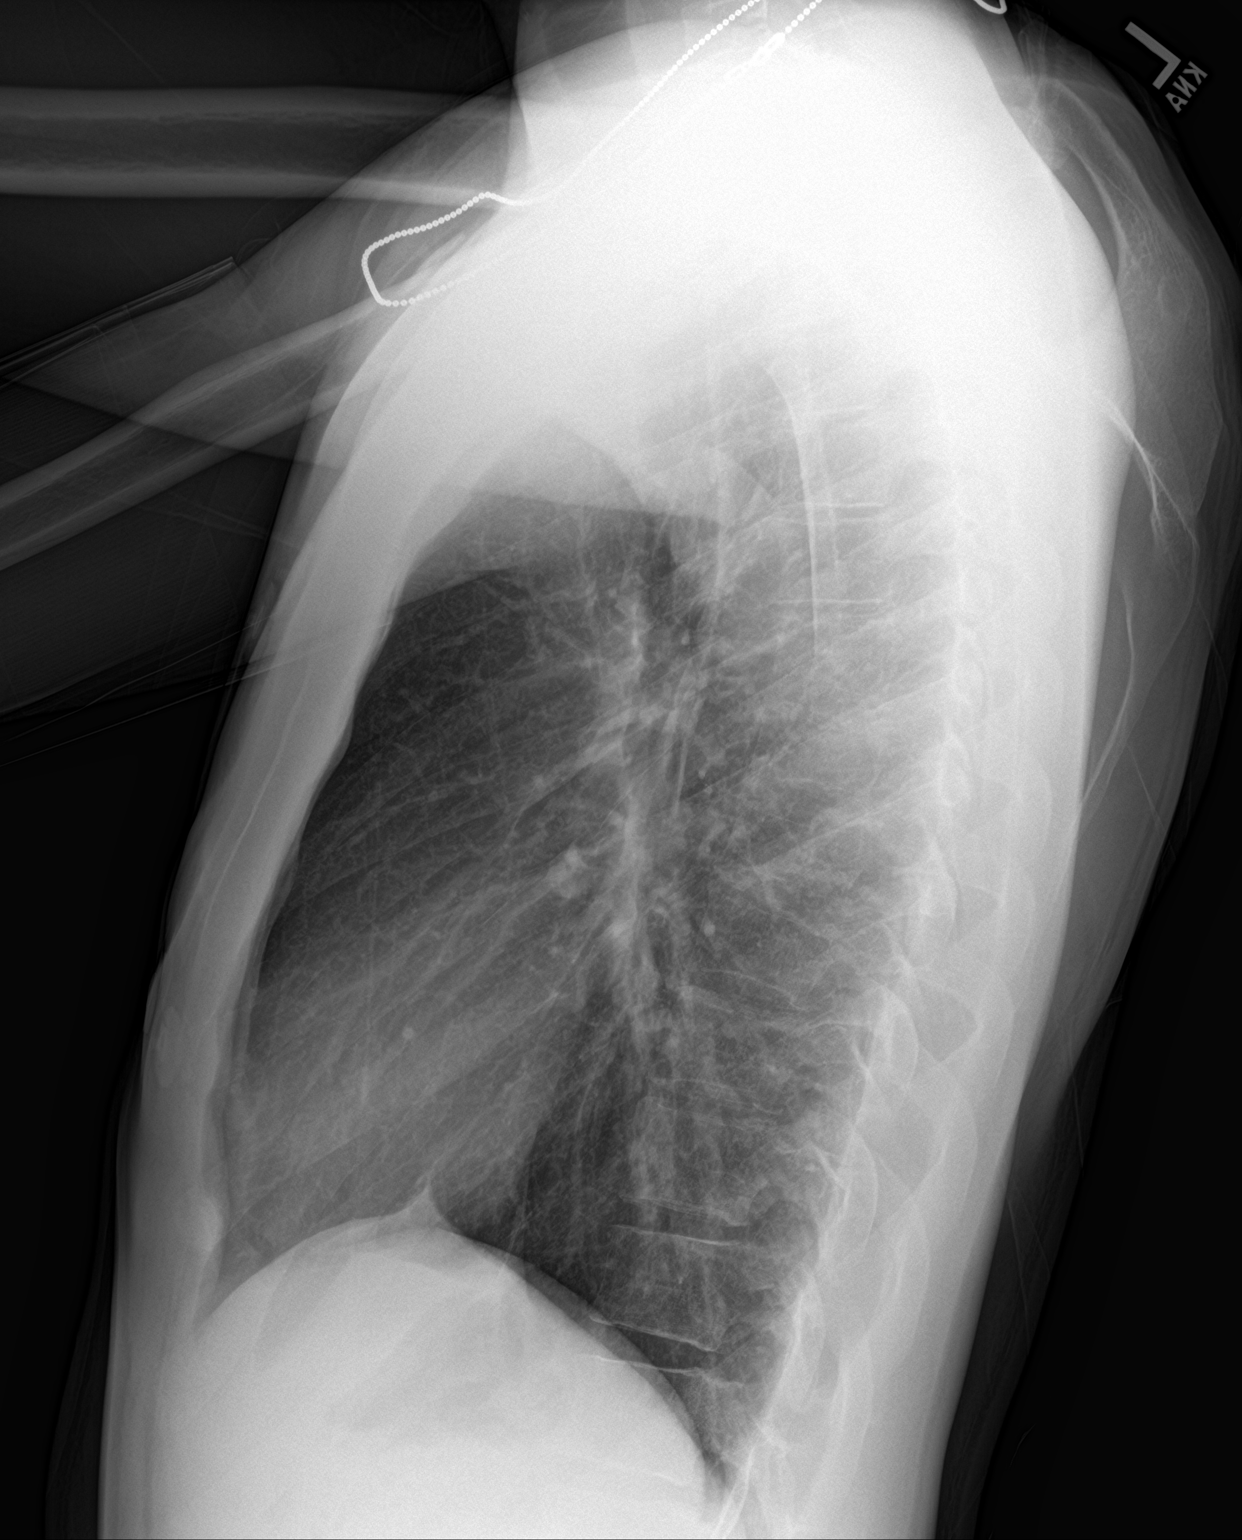

[2 of 2 positions shown; findings below may reference images not displayed]

FINDINGS: There are 2 nodular densities in the projection of both upper lobes
which were not identified on the previous exam. Normal heart size.
No pleural effusion or edema. No airspace opacities.
IMPRESSION: 1. No acute cardiopulmonary abnormalities.
2. There are 2 nodular densities overlying both upper lobes which
may be external to the patient, perhaps representing buttons/snaps.
Recommend removal of any external clothing which may be creating
artifact and repeat chest radiograph.
These results will be called to the ordering clinician or
representative by the Radiologist Assistant, and communication
documented in the PACS or zVision Dashboard.

## 2019-04-17 ENCOUNTER — Other Ambulatory Visit: Payer: Self-pay

## 2019-04-17 DIAGNOSIS — Z20822 Contact with and (suspected) exposure to covid-19: Secondary | ICD-10-CM

## 2019-04-18 LAB — NOVEL CORONAVIRUS, NAA: SARS-CoV-2, NAA: NOT DETECTED

## 2019-06-13 ENCOUNTER — Other Ambulatory Visit: Payer: Self-pay

## 2019-06-13 ENCOUNTER — Emergency Department (HOSPITAL_BASED_OUTPATIENT_CLINIC_OR_DEPARTMENT_OTHER)
Admission: EM | Admit: 2019-06-13 | Discharge: 2019-06-13 | Disposition: A | Discharge status: Home / Self Care | Payer: BLUE CROSS/BLUE SHIELD | Attending: Emergency Medicine | Admitting: Emergency Medicine

## 2019-06-13 ENCOUNTER — Ambulatory Visit (HOSPITAL_BASED_OUTPATIENT_CLINIC_OR_DEPARTMENT_OTHER)
Admit: 2019-06-13 | Discharge: 2019-06-13 | Disposition: A | Discharge status: Home / Self Care | Payer: BLUE CROSS/BLUE SHIELD | Source: Ambulatory Visit | Attending: Emergency Medicine | Admitting: Emergency Medicine

## 2019-06-13 ENCOUNTER — Encounter (HOSPITAL_BASED_OUTPATIENT_CLINIC_OR_DEPARTMENT_OTHER): Payer: Self-pay | Admitting: Emergency Medicine

## 2019-06-13 DIAGNOSIS — R2241 Localized swelling, mass and lump, right lower limb: Secondary | ICD-10-CM | POA: Insufficient documentation

## 2019-06-13 DIAGNOSIS — F17210 Nicotine dependence, cigarettes, uncomplicated: Secondary | ICD-10-CM | POA: Insufficient documentation

## 2019-06-13 DIAGNOSIS — M7989 Other specified soft tissue disorders: Secondary | ICD-10-CM | POA: Insufficient documentation

## 2019-06-13 DIAGNOSIS — M79604 Pain in right leg: Secondary | ICD-10-CM | POA: Diagnosis present

## 2019-06-13 MED ORDER — ENOXAPARIN SODIUM 80 MG/0.8ML ~~LOC~~ SOLN
1.0000 mg/kg | Freq: Once | SUBCUTANEOUS | Status: AC
Start: 2019-06-13 — End: 2019-06-13
  Administered 2019-06-13: 80 mg via SUBCUTANEOUS
  Filled 2019-06-13: qty 0.8

## 2019-06-13 MED ORDER — RIVAROXABAN (XARELTO) VTE STARTER PACK (15 & 20 MG): ORAL_TABLET | ORAL | 0 refills | Status: AC

## 2019-06-13 NOTE — ED Triage Notes (Signed)
Patient presents with complaints of right foot and lower leg pain; denies any known injury. States difficulty to bear weight.

## 2019-06-13 NOTE — ED Provider Notes (Signed)
Emergency Department Provider Note   I have reviewed the triage vital signs and the nursing notes.   HISTORY  Chief Complaint Foot Pain   HPI Carlos Hatfield is a 39 y.o. male without significant past medical history who presents the emergency department today with right leg pain.  Patient states is been going on for couple weeks but seems to be getting worse.  He has had some "knots" in the back of his right leg over his calf and then progressively worsening during this time.  He states they hurt to touch.  He states his right leg is more swollen than his left.  He states that he has not had this before.  He has no history of varicose veins.  Patient states that it hurts worse with ambulation.  Has not done anything to help the symptoms nor is he seen anyone about the symptoms yet.   No chest pain, shortness of breath, lightheadedness or palpitations.  No other associated or modifying symptoms.    History reviewed. No pertinent past medical history.  There are no problems to display for this patient.   History reviewed. No pertinent surgical history.  Current Outpatient Rx  . Order #: [id number] Class: Print  . Order #: [id number] Class: Historical Med  . Order #: [id number] Class: Print  . Order #: [id number] Class: Print    Allergies Patient has no known allergies.  No family history on file.  Social History Social History   Tobacco Use  . Smoking status: Current Every Day Smoker    Packs/day: 0.50    Types: Cigarettes  . Smokeless tobacco: Never Used  Substance Use Topics  . Alcohol use: Yes    Comment: once every 2 weeks  . Drug use: No    Review of Systems  All other systems negative except as documented in the HPI. All pertinent positives and negatives as reviewed in the HPI. ____________________________________________   PHYSICAL EXAM:  VITAL SIGNS: ED Triage Vitals  Enc Vitals Group     BP 06/13/19 0016 121/84     Pulse Rate 06/13/19 0016 62     Resp  06/13/19 0016 18     Temp 06/13/19 0016 98.2 F (36.8 C)     Temp Source 06/13/19 0016 Oral     SpO2 06/13/19 0016 100 %     Weight 06/13/19 0012 174 lb 2.6 oz (79 kg)     Height 06/13/19 0012 6\' 3"  (1.905 m)     Head Circumference --      Peak Flow --      Pain Score 06/13/19 0011 10     Pain Loc --      Pain Edu? --      Excl. in GC? --     Constitutional: Alert and oriented. Well appearing and in no acute distress. Eyes: Conjunctivae are normal. PERRL. EOMI. Head: Atraumatic. Nose: No congestion/rhinnorhea. Mouth/Throat: Mucous membranes are moist.  Oropharynx non-erythematous. Neck: No stridor.  No meningeal signs.   Cardiovascular: Normal rate, regular rhythm. Good peripheral circulation. Grossly normal heart sounds.   Respiratory: Normal respiratory effort.  No retractions. Lungs CTAB. Gastrointestinal: Soft and nontender. No distention.  Musculoskeletal: May be slight right greater than left leg edema, he does have tender cords over his right calf.  He has a couple tender papules under the skin posterior to his right lateral malleolus.  The pain in his calf is worse with dorsiflexion and palpation.  No discoloration.  Intact DP pulse. Neurologic:  Normal speech  and language. No gross focal neurologic deficits are appreciated.  Skin:  Skin is warm, dry and intact. No rash noted.  ____________________________________________   INITIAL IMPRESSION / ASSESSMENT AND PLAN / ED COURSE  DVT versus varicose veins versus superficial thrombophlebitis.  Give a dose of Lovenox here.  Prescription for Xarelto given if needed.  Will return tomorrow for ultrasound to evaluate for same.  No matter the results he will follow with his PCP for repeat prescriptions or further work-up as needed.  Pertinent labs & imaging results that were available during my care of the patient were reviewed by me and considered in my medical decision making (see chart for details).   A medical screening exam was  performed and I feel the patient has had an appropriate workup for their chief complaint at this time and likelihood of emergent condition existing is low. They have been counseled on decision, discharge, follow up and which symptoms necessitate immediate return to the emergency department. They or their family verbally stated understanding and agreement with plan and discharged in stable condition.   ____________________________________________  FINAL CLINICAL IMPRESSION(S) / ED DIAGNOSES  Final diagnoses:  Leg swelling   MEDICATIONS GIVEN DURING THIS VISIT:  Medications  enoxaparin (LOVENOX) injection 80 mg (80 mg Subcutaneous Given 06/13/19 0121)     NEW OUTPATIENT MEDICATIONS STARTED DURING THIS VISIT:  Discharge Medication List as of 06/13/2019  1:24 AM    START taking these medications   Details  Rivaroxaban 15 & 20 MG TBPK Follow package directions: Take one 15mg  tablet by mouth twice a day. On day 22, switch to one 20mg  tablet once a day. Take with food., Print        Note:  This note was prepared with assistance of Dragon voice recognition software. Occasional wrong-word or sound-a-like substitutions may have occurred due to the inherent limitations of voice recognition software.   Trenice Mesa, Corene Cornea, MD 06/13/19 [phone]

## 2020-02-12 ENCOUNTER — Encounter (HOSPITAL_BASED_OUTPATIENT_CLINIC_OR_DEPARTMENT_OTHER): Payer: Self-pay | Admitting: *Deleted

## 2020-02-12 ENCOUNTER — Other Ambulatory Visit: Payer: Self-pay

## 2020-02-12 DIAGNOSIS — R197 Diarrhea, unspecified: Secondary | ICD-10-CM | POA: Diagnosis not present

## 2020-02-12 DIAGNOSIS — R509 Fever, unspecified: Secondary | ICD-10-CM | POA: Insufficient documentation

## 2020-02-12 DIAGNOSIS — Z20822 Contact with and (suspected) exposure to covid-19: Secondary | ICD-10-CM | POA: Insufficient documentation

## 2020-02-12 DIAGNOSIS — M791 Myalgia, unspecified site: Secondary | ICD-10-CM | POA: Diagnosis not present

## 2020-02-12 DIAGNOSIS — F17210 Nicotine dependence, cigarettes, uncomplicated: Secondary | ICD-10-CM | POA: Insufficient documentation

## 2020-02-12 DIAGNOSIS — J029 Acute pharyngitis, unspecified: Secondary | ICD-10-CM | POA: Insufficient documentation

## 2020-02-12 DIAGNOSIS — R05 Cough: Secondary | ICD-10-CM | POA: Diagnosis not present

## 2020-02-12 DIAGNOSIS — R439 Unspecified disturbances of smell and taste: Secondary | ICD-10-CM | POA: Insufficient documentation

## 2020-02-12 DIAGNOSIS — R0602 Shortness of breath: Secondary | ICD-10-CM | POA: Insufficient documentation

## 2020-02-12 NOTE — ED Triage Notes (Signed)
Fever, cough, sob, diarrhea, loss of taste, loss of smell and cough x 2 days.

## 2020-02-13 ENCOUNTER — Emergency Department (HOSPITAL_BASED_OUTPATIENT_CLINIC_OR_DEPARTMENT_OTHER)
Admission: EM | Admit: 2020-02-13 | Discharge: 2020-02-13 | Disposition: A | Discharge status: Home / Self Care | Payer: BC Managed Care – PPO | Attending: Emergency Medicine | Admitting: Emergency Medicine

## 2020-02-13 DIAGNOSIS — Z20822 Contact with and (suspected) exposure to covid-19: Secondary | ICD-10-CM

## 2020-02-13 LAB — SARS CORONAVIRUS 2 BY RT PCR (HOSPITAL ORDER, PERFORMED IN ~~LOC~~ HOSPITAL LAB): SARS Coronavirus 2: NEGATIVE

## 2020-02-13 NOTE — ED Provider Notes (Signed)
[facility] Provider Note: Lowella Dell, MD, FACEP  CSN: [id number] MRN: [id number] ARRIVAL: 02/12/20 at 2252 ROOM: [facility]   CHIEF COMPLAINT  Covid Symptoms   HISTORY OF PRESENT ILLNESS  02/13/20 2:42 AM Carlos Hatfield is a 39 y.o. male with 2 days of fever, cough, mild shortness of breath, diarrhea, loss of taste and smell.  He has had body aches and a mild sore throat.  Symptoms in general have been moderate.  His most concerning symptom is loss of taste.  He has not taken anything for his symptoms.   History reviewed. No pertinent past medical history.  History reviewed. No pertinent surgical history.  No family history on file.  Social History   Tobacco Use  . Smoking status: Current Every Day Smoker    Packs/day: 0.50    Types: Cigarettes  . Smokeless tobacco: Never Used  Substance Use Topics  . Alcohol use: Yes    Comment: once every 2 weeks  . Drug use: No    Prior to Admission medications   Medication Sig Start Date End Date Taking? Authorizing Provider  albuterol (PROVENTIL HFA;VENTOLIN HFA) 108 (90 BASE) MCG/ACT inhaler Inhale 1-2 puffs into the lungs every 6 (six) hours as needed for wheezing or shortness of breath. 09/21/14   Elson Areas, PA-C  Rivaroxaban 15 & 20 MG TBPK Follow package directions: Take one 15mg  tablet by mouth twice a day. On day 22, switch to one 20mg  tablet once a day. Take with food. 06/13/19   Mesner, , MD    Allergies Patient has no known allergies.   REVIEW OF SYSTEMS  Negative except as noted here or in the History of Present Illness.   PHYSICAL EXAMINATION  Initial Vital Signs Blood pressure 112/73, pulse 60, temperature 98 F (36.7 C), temperature source Oral, resp. rate 17, height 6\' 3"  (1.905 m), weight 77.1 kg, SpO2 99 %.  Examination General: Well-developed, well-nourished male in no acute distress; appearance consistent with age of record HENT: normocephalic; atraumatic Eyes: pupils equal,  round and reactive to light; extraocular muscles intact Neck: supple Heart: regular rate and rhythm Lungs: clear to auscultation bilaterally Abdomen: soft; nondistended; nontender; bowel sounds present Extremities: No deformity; full range of motion; pulses normal Neurologic: Awake, alert and oriented; motor function intact in all extremities and symmetric; no facial droop Skin: Warm and dry Psychiatric: Normal mood and affect   RESULTS  Summary of this visit's results, reviewed and interpreted by myself:   EKG Interpretation  Date/Time:    Ventricular Rate:    PR Interval:    QRS Duration:   QT Interval:    QTC Calculation:   R Axis:     Text Interpretation:        Laboratory Studies: Results for orders placed or performed during the hospital encounter of 02/13/20 (from the past 24 hour(s))  SARS Coronavirus 2 by RT PCR (hospital order, performed in [facility] hospital lab) Nasopharyngeal Nasopharyngeal Swab     Status: None   Collection Time: 02/12/20 11:01 PM   Specimen: Nasopharyngeal Swab  Result Value Ref Range   SARS Coronavirus 2 NEGATIVE NEGATIVE   Imaging Studies: No results found.  ED COURSE and MDM  Nursing notes, initial and subsequent vitals signs, including pulse oximetry, reviewed and interpreted by myself.  Vitals:   02/12/20 2256 02/12/20 2259 02/13/20 0127  BP:  116/79 112/73  Pulse:  70 60  Resp:  20 17  Temp:  98 F (36.7 C) 98  F (36.7 C)  TempSrc:  Oral Oral  SpO2:  100% 99%  Weight: 77.1 kg    Height: 6\' 3"  (1.905 m)     Medications - No data to display  Although the patient's Covid test is negative I suspect this is a false negative.  He was advised that another test in 2 days is not unreasonable.  He was advised that [facility] or other sites are likely significantly cheaper than the ED.  PROCEDURES  Procedures   ED DIAGNOSES     ICD-10-CM   1. Suspected COVID-19 virus infection  Z20.822        Linc Renne, , MD 02/13/20  [phone]

## 2020-02-21 ENCOUNTER — Encounter (HOSPITAL_BASED_OUTPATIENT_CLINIC_OR_DEPARTMENT_OTHER): Payer: Self-pay | Admitting: Emergency Medicine

## 2020-02-21 ENCOUNTER — Emergency Department (HOSPITAL_BASED_OUTPATIENT_CLINIC_OR_DEPARTMENT_OTHER)
Admission: EM | Admit: 2020-02-21 | Discharge: 2020-02-21 | Disposition: A | Discharge status: Home / Self Care | Payer: BC Managed Care – PPO | Attending: Emergency Medicine | Admitting: Emergency Medicine

## 2020-02-21 ENCOUNTER — Emergency Department (HOSPITAL_BASED_OUTPATIENT_CLINIC_OR_DEPARTMENT_OTHER): Payer: BC Managed Care – PPO

## 2020-02-21 ENCOUNTER — Other Ambulatory Visit: Payer: Self-pay

## 2020-02-21 DIAGNOSIS — R197 Diarrhea, unspecified: Secondary | ICD-10-CM

## 2020-02-21 DIAGNOSIS — R0602 Shortness of breath: Secondary | ICD-10-CM | POA: Insufficient documentation

## 2020-02-21 DIAGNOSIS — R05 Cough: Secondary | ICD-10-CM | POA: Diagnosis not present

## 2020-02-21 DIAGNOSIS — Z20822 Contact with and (suspected) exposure to covid-19: Secondary | ICD-10-CM | POA: Diagnosis not present

## 2020-02-21 DIAGNOSIS — F17210 Nicotine dependence, cigarettes, uncomplicated: Secondary | ICD-10-CM | POA: Insufficient documentation

## 2020-02-21 DIAGNOSIS — R5383 Other fatigue: Secondary | ICD-10-CM | POA: Diagnosis not present

## 2020-02-21 DIAGNOSIS — R059 Cough, unspecified: Secondary | ICD-10-CM

## 2020-02-21 LAB — RESPIRATORY PANEL BY RT PCR (FLU A&B, COVID)
Influenza A by PCR: NEGATIVE
Influenza B by PCR: NEGATIVE
SARS Coronavirus 2 by RT PCR: NEGATIVE

## 2020-02-21 LAB — CBC WITH DIFFERENTIAL/PLATELET
Abs Immature Granulocytes: 0.03 10*3/uL (ref 0.00–0.07)
Basophils Absolute: 0.1 10*3/uL (ref 0.0–0.1)
Basophils Relative: 1 %
Eosinophils Absolute: 0.2 10*3/uL (ref 0.0–0.5)
Eosinophils Relative: 3 %
HCT: 44 % (ref 39.0–52.0)
Hemoglobin: 14.8 g/dL (ref 13.0–17.0)
Immature Granulocytes: 0 %
Lymphocytes Relative: 20 %
Lymphs Abs: 1.9 10*3/uL (ref 0.7–4.0)
MCH: 32.4 pg (ref 26.0–34.0)
MCHC: 33.6 g/dL (ref 30.0–36.0)
MCV: 96.3 fL (ref 80.0–100.0)
Monocytes Absolute: 0.7 10*3/uL (ref 0.1–1.0)
Monocytes Relative: 7 %
Neutro Abs: 6.8 10*3/uL (ref 1.7–7.7)
Neutrophils Relative %: 69 %
Platelets: 263 10*3/uL (ref 150–400)
RBC: 4.57 MIL/uL (ref 4.22–5.81)
RDW: 13.2 % (ref 11.5–15.5)
WBC: 9.7 10*3/uL (ref 4.0–10.5)
nRBC: 0 % (ref 0.0–0.2)

## 2020-02-21 LAB — COMPREHENSIVE METABOLIC PANEL
ALT: 24 U/L (ref 0–44)
AST: 33 U/L (ref 15–41)
Albumin: 4 g/dL (ref 3.5–5.0)
Alkaline Phosphatase: 61 U/L (ref 38–126)
Anion gap: 10 (ref 5–15)
BUN: 10 mg/dL (ref 6–20)
CO2: 26 mmol/L (ref 22–32)
Calcium: 9.1 mg/dL (ref 8.9–10.3)
Chloride: 103 mmol/L (ref 98–111)
Creatinine, Ser: 0.79 mg/dL (ref 0.61–1.24)
GFR calc Af Amer: >60 mL/min (ref >60)
GFR calc non Af Amer: >60 mL/min (ref >60)
Glucose, Bld: 100 mg/dL — ABNORMAL HIGH (ref 70–99)
Potassium: 4.1 mmol/L (ref 3.5–5.1)
Sodium: 139 mmol/L (ref 135–145)
Total Bilirubin: 1.2 mg/dL (ref 0.3–1.2)
Total Protein: 6.6 g/dL (ref 6.5–8.1)

## 2020-02-21 LAB — LIPASE, BLOOD: Lipase: 30 U/L (ref 11–51)

## 2020-02-21 MED ORDER — ACETAMINOPHEN 325 MG PO TABS
650.0000 mg | ORAL_TABLET | Freq: Once | ORAL | Status: AC
  Administered 2020-02-21: 650 mg via ORAL
  Filled 2020-02-21: qty 2

## 2020-02-21 MED ORDER — LOPERAMIDE HCL 2 MG PO CAPS: 2.0000 mg | ORAL_CAPSULE | Freq: Four times a day (QID) | ORAL | 0 refills | Status: DC | PRN

## 2020-02-21 MED ORDER — SODIUM CHLORIDE 0.9 % IV BOLUS
1000.0000 mL | Freq: Once | INTRAVENOUS | Status: AC
  Administered 2020-02-21: 1000 mL via INTRAVENOUS

## 2020-02-21 MED FILL — LOPERAMIDE 2 MG CAPSULE: 2 | 3 days supply | Qty: 12 | Fill #0

## 2020-02-21 NOTE — ED Provider Notes (Signed)
[facility] EMERGENCY DEPARTMENT Provider Note   CSN: [id number] Arrival date & time: 02/21/20  9678     History Chief Complaint  Patient presents with  . Headache  . Diarrhea    Carlos Hatfield is a 39 y.o. male who presents for evaluation of fatigue, loss of taste and smell, cough, shortness of breath, chills at a been ongoing for the last week and a half.  He states that prior to onset of symptoms, he attended a birthday party and states that there was several members that were Covid positive.  He is not vaccinated.  He was seen initially at onset of symptoms and was negative for Covid.  He states that he has continued to feel poorly.  He reports that about 5 days ago, he started developing diarrhea.  He reports multiple episodes of nonbloody diarrhea.  He states that he does not have much of an appetite but whenever he eats something, he subsequently then has some diarrhea.  He has some abdominal pain with diarrhea but otherwise no abdominal pain.  No vomiting.  He has not been on any recent antibiotics and denies any travel outside the country.  He also has had some headache and dry cough.  He does report he smokes about 5 to 6 cigarettes a day.  He does not have any history of COPD or asthma. He denies any CP.    The history is provided by the patient.       History reviewed. No pertinent past medical history.  There are no problems to display for this patient.   History reviewed. No pertinent surgical history.     No family history on file.  Social History   Tobacco Use  . Smoking status: Current Every Day Smoker    Packs/day: 0.50    Types: Cigarettes  . Smokeless tobacco: Never Used  Substance Use Topics  . Alcohol use: Yes    Comment: once every 2 weeks  . Drug use: No    Home Medications Prior to Admission medications   Medication Sig Start Date End Date Taking? Authorizing Provider  albuterol (PROVENTIL HFA;VENTOLIN HFA) 108 (90 BASE) MCG/ACT  inhaler Inhale 1-2 puffs into the lungs every 6 (six) hours as needed for wheezing or shortness of breath. 09/21/14   Elson Areas, PA-C  loperamide (IMODIUM) 2 MG capsule Take 1 capsule (2 mg total) by mouth 4 (four) times daily as needed for diarrhea or loose stools. 02/21/20   Maxwell Caul, PA-C  Rivaroxaban 15 & 20 MG TBPK Follow package directions: Take one 15mg  tablet by mouth twice a day. On day 22, switch to one 20mg  tablet once a day. Take with food. 06/13/19   Mesner, , MD    Allergies    Patient has no known allergies.  Review of Systems   Review of Systems  Constitutional: Positive for appetite change and fatigue. Negative for fever.  Respiratory: Positive for cough and shortness of breath.   Cardiovascular: Negative for chest pain.  Gastrointestinal: Positive for diarrhea. Negative for abdominal pain, nausea and vomiting.  Genitourinary: Negative for dysuria and hematuria.  Neurological: Negative for weakness, numbness and headaches.  All other systems reviewed and are negative.   Physical Exam Updated Vital Signs BP 114/74 (BP Location: Left Arm)   Pulse 69   Temp 97.7 F (36.5 C) (Oral)   Resp 16   SpO2 99%   Physical Exam Vitals and nursing note reviewed.  Constitutional:  Appearance: Normal appearance. He is well-developed.  HENT:     Head: Normocephalic and atraumatic.  Eyes:     General: Lids are normal.     Conjunctiva/sclera: Conjunctivae normal.     Pupils: Pupils are equal, round, and reactive to light.  Cardiovascular:     Rate and Rhythm: Normal rate and regular rhythm.     Pulses: Normal pulses.     Heart sounds: Normal heart sounds. No murmur heard.  No friction rub. No gallop.   Pulmonary:     Effort: Pulmonary effort is normal.     Breath sounds: Normal breath sounds.     Comments: Lungs clear to auscultation bilaterally.  Symmetric chest rise.  No wheezing, rales, rhonchi. Abdominal:     Palpations: Abdomen is soft. Abdomen  is not rigid.     Tenderness: There is no abdominal tenderness. There is no guarding.     Comments: Abdomen is soft, non-distended, non-tender. No rigidity, No guarding. No peritoneal signs.  Musculoskeletal:        General: Normal range of motion.     Cervical back: Full passive range of motion without pain.  Skin:    General: Skin is warm and dry.     Capillary Refill: Capillary refill takes less than 2 seconds.  Neurological:     Mental Status: He is alert and oriented to person, place, and time.  Psychiatric:        Speech: Speech normal.     ED Results / Procedures / Treatments   Labs (all labs ordered are listed, but only abnormal results are displayed) Labs Reviewed  COMPREHENSIVE METABOLIC PANEL - Abnormal; Notable for the following components:      Result Value   Glucose, Bld 100 (*)    All other components within normal limits  RESPIRATORY PANEL BY RT PCR (FLU A&B, COVID)  CBC WITH DIFFERENTIAL/PLATELET  LIPASE, BLOOD    EKG None  Radiology DG Chest Port 1 View  Result Date: 02/21/2020 CLINICAL DATA:  Provided history: Cough. Additional provided: Headache x4 days, diarrhea x1 week, negative COVID test 02/13/2020. EXAM: PORTABLE CHEST 1 VIEW COMPARISON:  Prior chest radiographs 10/13/2016 and earlier. FINDINGS: Heart size within normal limits. There is no appreciable airspace consolidation. No evidence of pleural effusion or pneumothorax. No acute bony abnormality identified. Subtle thoracic levocurvature. IMPRESSION: No evidence of active cardiopulmonary disease. Electronically Signed   By: Jackey Loge DO   On: 02/21/2020 10:58    Procedures Procedures (including critical care time)  Medications Ordered in ED Medications  sodium chloride 0.9 % bolus 1,000 mL (0 mLs Intravenous Stopped 02/21/20 1341)  acetaminophen (TYLENOL) tablet 650 mg (650 mg Oral Given 02/21/20 1228)    ED Course  I have reviewed the triage vital signs and the nursing notes.  Pertinent  labs & imaging results that were available during my care of the patient were reviewed by me and considered in my medical decision making (see chart for details).    MDM Rules/Calculators/A&P                          39 year old male who presents for evaluation of fatigue, diarrhea, cough, shortness of breath that has been ongoing for over a week and a half.  He reports that over the last 5 days, he has had worsening diarrhea.  No blood in stool.  No vomiting but he states he has not had any appetite.  He is not vaccinated  for Covid.  He does report that he went to a birthday party and there is Covid positive people there.  Initial arrival, he is afebrile, nontoxic-appearing.  Vital signs are stable.  Benign abdominal exam.  Concern for viral process versus infectious process versus Covid.  Plan to check labs, Covid, chest x-ray.  CBC shows no leukocytosis or anemia.  CMP shows normal BUN and creatinine.  Covid is negative.  Lipase is normal.  Chest x-ray shows no evidence of pneumonia.  Discussed results with patient.  At this time, he is hemodynamically stable.  Abdomen exam is benign. No concern for surgical abdomen. No indication for CT. unclear etiology of his diarrhea.  No recent antibiotic use, travel.  Low suspicion for C. difficile.  We will plan to send home with some Imodium for diarrhea relief.  Encouraged continued quarantine given his symptoms.  Checked him that if he continues have symptoms, you will need a repeat Covid test. At this time, patient exhibits no emergent life-threatening condition that require further evaluation in ED. Patient had ample opportunity for questions and discussion. All patient's questions were answered with full understanding. Strict return precautions discussed. Patient expresses understanding and agreement to plan.   Portions of this note were generated with Scientist, clinical (histocompatibility and immunogenetics). Dictation errors may occur despite best attempts at proofreading.  Berlie Hatchel was evaluated in Emergency Department on 02/21/2020 for the symptoms described in the history of present illness. He was evaluated in the context of the global COVID-19 pandemic, which necessitated consideration that the patient might be at risk for infection with the SARS-CoV-2 virus that causes COVID-19. Institutional protocols and algorithms that pertain to the evaluation of patients at risk for COVID-19 are in a state of rapid change based on information released by regulatory bodies including the CDC and federal and state organizations. These policies and algorithms were followed during the patient's care in the ED.  Final Clinical Impression(s) / ED Diagnoses Final diagnoses:  Diarrhea, unspecified type    Rx / DC Orders ED Discharge Orders         Ordered    loperamide (IMODIUM) 2 MG capsule  4 times daily PRN        02/21/20 1358           Maxwell Caul, PA-C 02/21/20 1408    Milagros Loll, MD 02/22/20 [phone]

## 2020-02-21 NOTE — ED Triage Notes (Signed)
Headache x 4 days, diarrhea x 1 week. Negative covid test 02/13/20

## 2020-02-21 NOTE — Discharge Instructions (Signed)
Take immodium as directed.  Make sure you are staying hydrated drink clear fluids.  You should still quarantine as you are having symptoms.  You continue to have symptoms, you will need to be retested for Covid to ensure he did not have a false negative.  Return the emergency department for any fever, difficulty breathing, vomiting, blood in your stools or any other worsening concerning symptoms.

## 2021-10-26 IMAGING — US US EXTREM LOW VENOUS*R*
1 series · 13 of 24 positions shown · non-contrast
Comparison: None.

CLINICAL DATA: Right lower extremity swelling



[Series 1: us extrem low venous*right* · 13 of 35 slices shown]
[im 1/35]
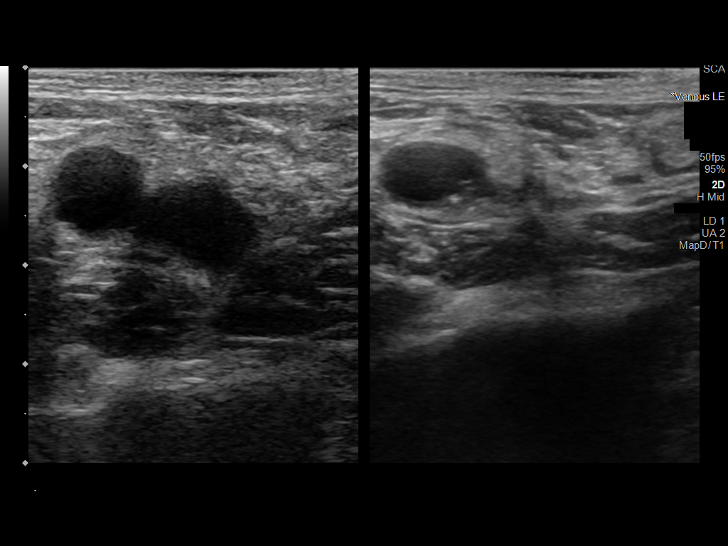
[im 3/35]
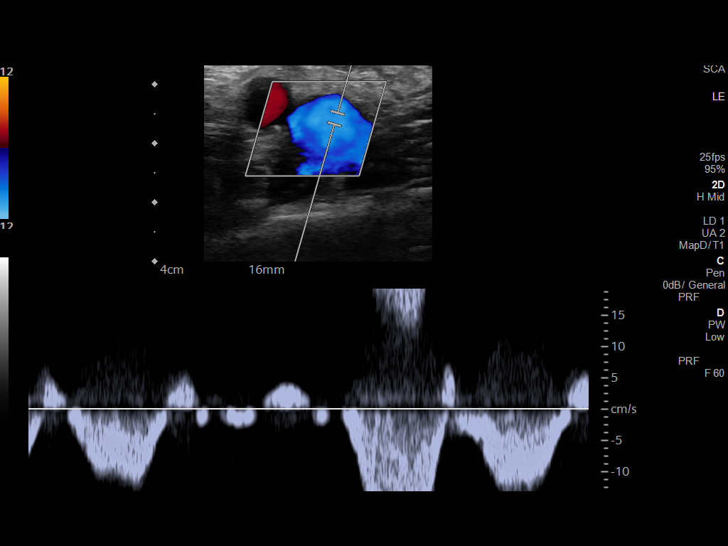
[im 6/35]
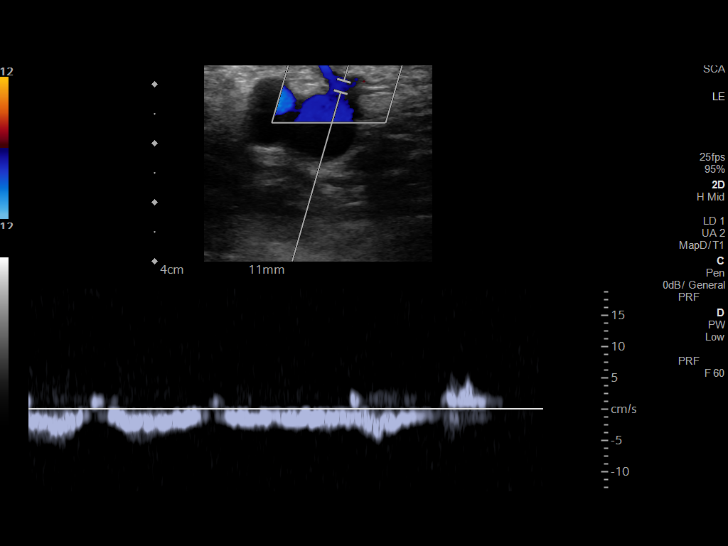
[im 9/35]
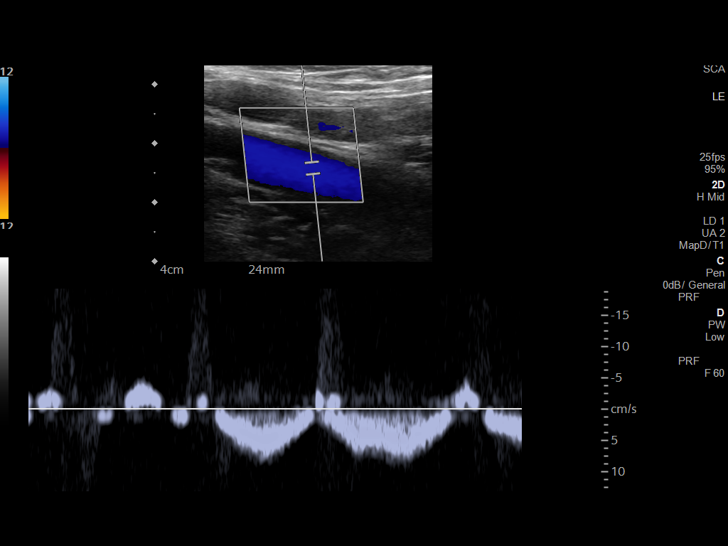
[im 12/35]
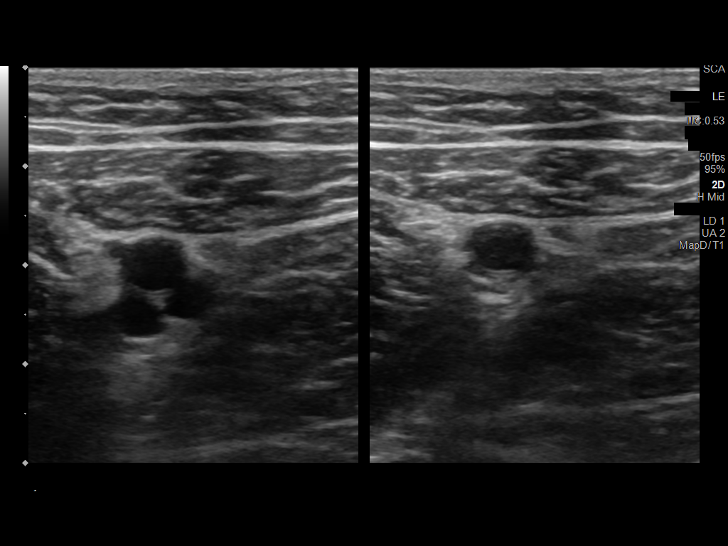
[im 15/35]
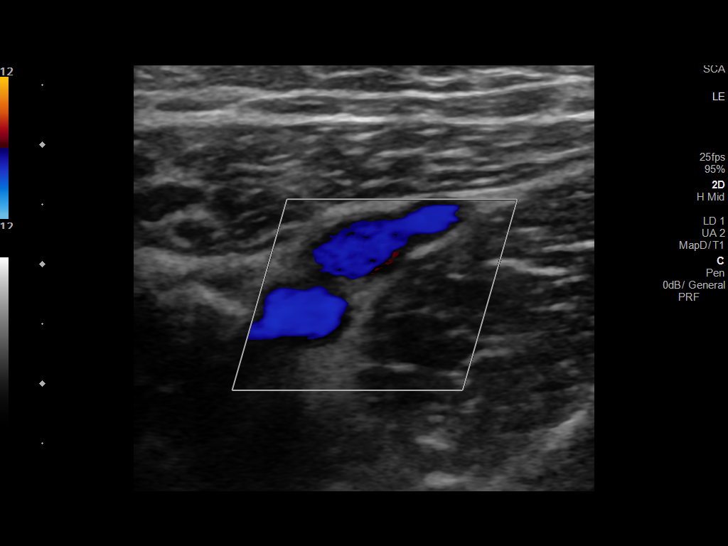
[im 18/35]
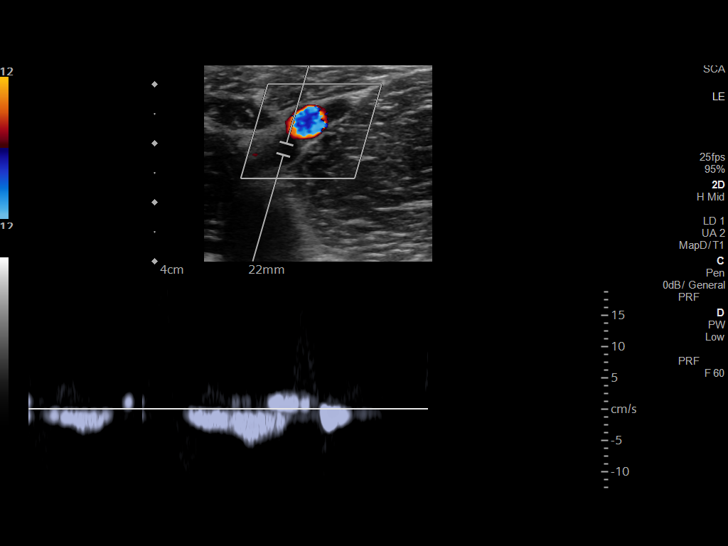
[im 20/35]
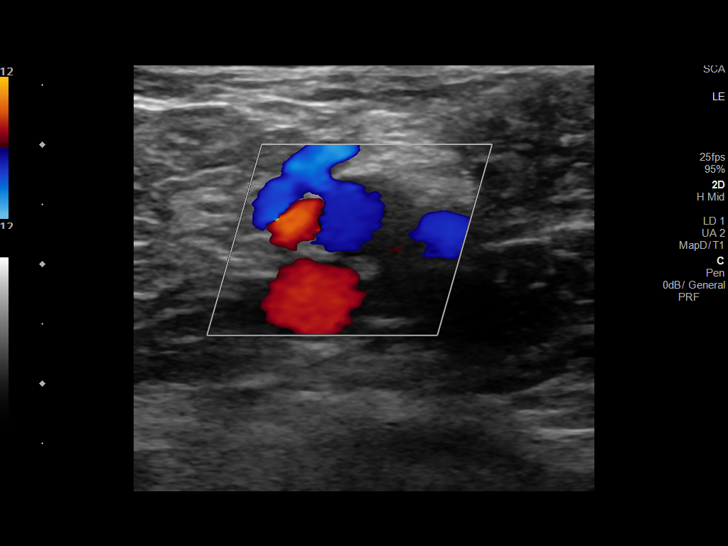
[im 23/35]
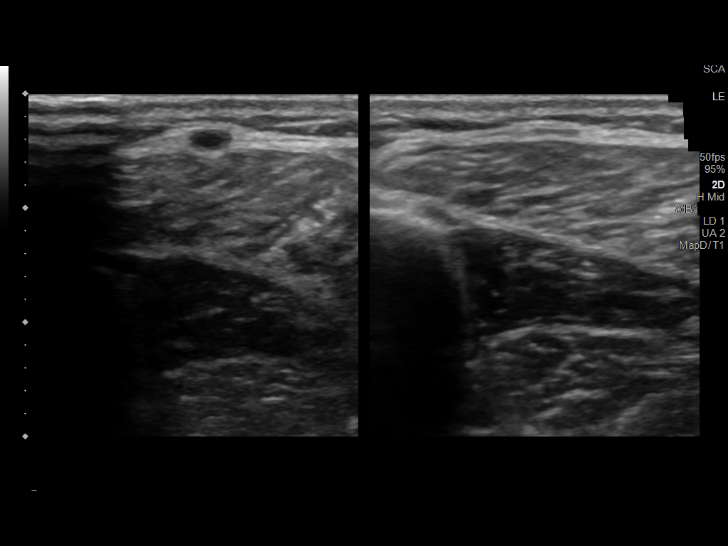
[im 26/35]
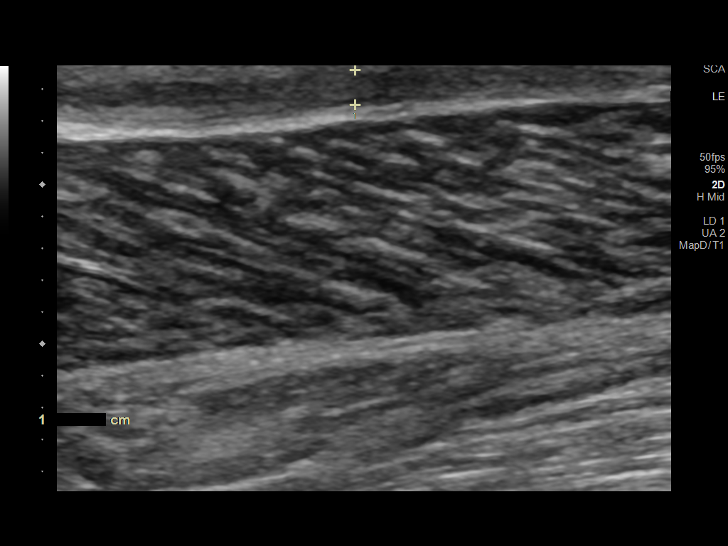
[im 29/35]
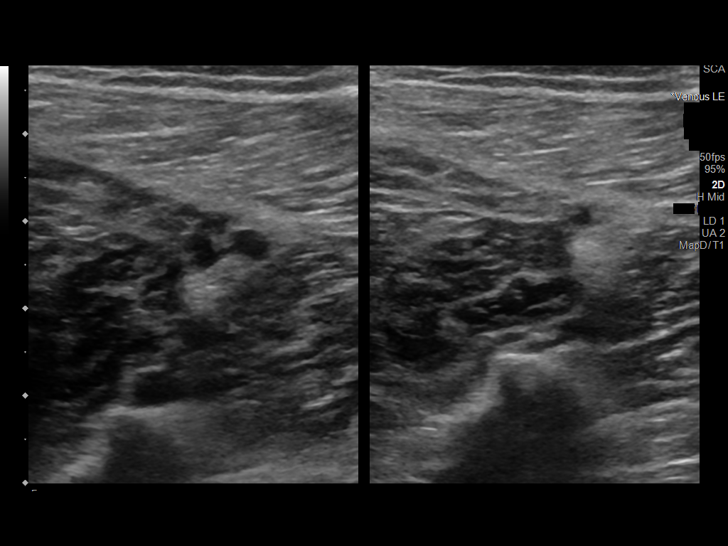
[im 32/35]
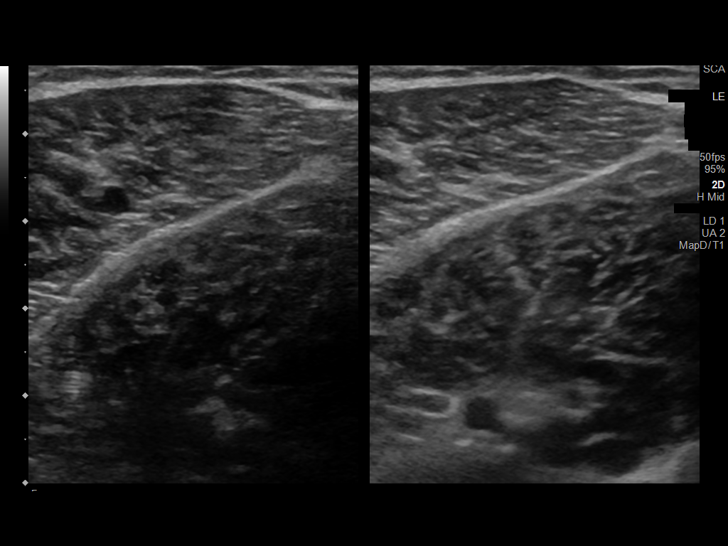
[im 35/35]
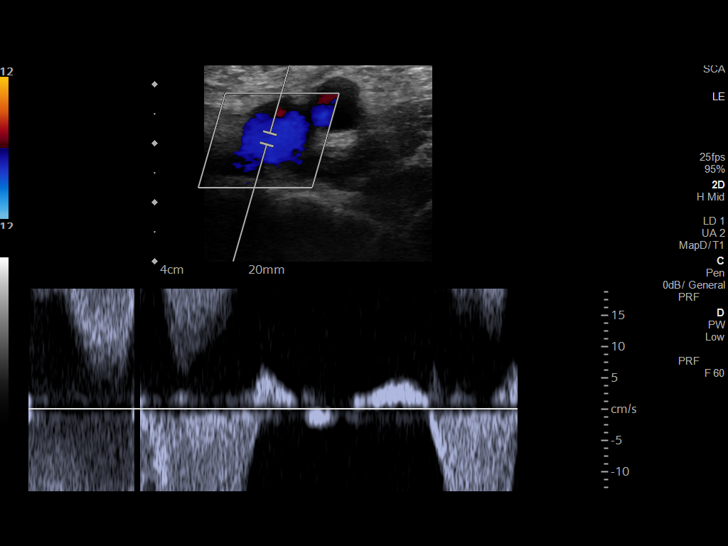

[13 of 24 positions shown; findings below may reference images not displayed]

FINDINGS: Contralateral Common Femoral Vein: Respiratory phasicity is normal
and symmetric with the symptomatic side. No evidence of thrombus.
Normal compressibility.

Common Femoral Vein: No evidence of thrombus. Normal
compressibility, respiratory phasicity and response to augmentation.

Saphenofemoral Junction: No evidence of thrombus. Normal
compressibility and flow on color Doppler imaging.

Profunda Femoral Vein: No evidence of thrombus. Normal
compressibility and flow on color Doppler imaging.

Femoral Vein: No evidence of thrombus. Normal compressibility,
respiratory phasicity and response to augmentation.

Popliteal Vein: No evidence of thrombus. Normal compressibility,
respiratory phasicity and response to augmentation.

Calf Veins: No evidence of thrombus. Normal compressibility and flow
on color Doppler imaging.

Superficial Great Saphenous Vein: Thrombus is noted within the
greater saphenous vein in the calf. Decreased compressibility is
noted.

Venous Reflux:  None.

Other Findings:  None.
IMPRESSION: No evidence of deep venous thrombosis.

Superficial thrombophlebitis within the greater saphenous vein
within the right calf.

## 2022-05-31 ENCOUNTER — Other Ambulatory Visit: Payer: Self-pay

## 2022-05-31 ENCOUNTER — Encounter (HOSPITAL_BASED_OUTPATIENT_CLINIC_OR_DEPARTMENT_OTHER): Payer: Self-pay | Admitting: Emergency Medicine

## 2022-05-31 ENCOUNTER — Emergency Department (HOSPITAL_BASED_OUTPATIENT_CLINIC_OR_DEPARTMENT_OTHER)
Admission: EM | Admit: 2022-05-31 | Discharge: 2022-05-31 | Disposition: A | Discharge status: Home / Self Care | Payer: BC Managed Care – PPO | Attending: Emergency Medicine | Admitting: Emergency Medicine

## 2022-05-31 DIAGNOSIS — Z20822 Contact with and (suspected) exposure to covid-19: Secondary | ICD-10-CM | POA: Diagnosis not present

## 2022-05-31 DIAGNOSIS — J111 Influenza due to unidentified influenza virus with other respiratory manifestations: Secondary | ICD-10-CM

## 2022-05-31 DIAGNOSIS — J101 Influenza due to other identified influenza virus with other respiratory manifestations: Secondary | ICD-10-CM | POA: Diagnosis not present

## 2022-05-31 DIAGNOSIS — R509 Fever, unspecified: Secondary | ICD-10-CM | POA: Diagnosis present

## 2022-05-31 LAB — RESP PANEL BY RT-PCR (RSV, FLU A&B, COVID)  RVPGX2
Influenza A by PCR: NEGATIVE
Influenza B by PCR: POSITIVE — AB
Resp Syncytial Virus by PCR: NEGATIVE
SARS Coronavirus 2 by RT PCR: NEGATIVE

## 2022-05-31 MED ORDER — ACETAMINOPHEN 325 MG PO TABS
650.0000 mg | ORAL_TABLET | Freq: Once | ORAL | Status: AC
  Administered 2022-05-31: 650 mg via ORAL
  Filled 2022-05-31: qty 2

## 2022-05-31 MED ORDER — BENZONATATE 100 MG PO CAPS: 100.0000 mg | ORAL_CAPSULE | Freq: Three times a day (TID) | ORAL | 0 refills | Status: AC

## 2022-05-31 NOTE — ED Provider Notes (Signed)
[facility] EMERGENCY DEPT Provider Note   CSN: [id number] Arrival date & time: 05/31/22  3785     History {Add pertinent medical, surgical, social history, OB history to HPI:1} Chief Complaint  Patient presents with   Fever    Carlos Hatfield is a 42 y.o. male with no significant past medical history presenting to the emergency department for evaluation of flulike symptoms.  Patient reports he has has fever, chills, cough, shortness of breath, body aches, fatigue since Wednesday night.  Patient reports that he was at work with other people who has been coughing.  He denies any chest pain, nausea, vomiting, constipation, diarrhea.  Patient reports he has tried ibuprofen at home for fever.   Fever Associated symptoms: chills and cough     History reviewed. No pertinent past medical history. History reviewed. No pertinent surgical history.   Home Medications Prior to Admission medications   Medication Sig Start Date End Date Taking? Authorizing Provider  albuterol (PROVENTIL HFA;VENTOLIN HFA) 108 (90 BASE) MCG/ACT inhaler Inhale 1-2 puffs into the lungs every 6 (six) hours as needed for wheezing or shortness of breath. 09/21/14   Elson Areas, PA-C  loperamide (IMODIUM) 2 MG capsule Take 1 capsule (2 mg total) by mouth 4 (four) times daily as needed for diarrhea or loose stools. 02/21/20   Maxwell Caul, PA-C  Rivaroxaban 15 & 20 MG TBPK Follow package directions: Take one 15mg  tablet by mouth twice a day. On day 22, switch to one 20mg  tablet once a day. Take with food. 06/13/19   Mesner, , MD      Allergies    Patient has no known allergies.    Review of Systems   Review of Systems  Constitutional:  Positive for chills and fever.  Respiratory:  Positive for cough.     Physical Exam Updated Vital Signs BP 99/73 (BP Location: Right Arm)   Pulse 98   Temp (!) 101.1 F (38.4 C) (Oral)   Resp 18   SpO2 97%  Physical Exam Vitals and nursing note  reviewed.  Constitutional:      Appearance: Normal appearance.  HENT:     Head: Normocephalic and atraumatic.     Mouth/Throat:     Mouth: Mucous membranes are moist.  Eyes:     General: No scleral icterus. Cardiovascular:     Rate and Rhythm: Normal rate and regular rhythm.     Pulses: Normal pulses.     Heart sounds: Normal heart sounds.  Pulmonary:     Effort: Pulmonary effort is normal.     Breath sounds: Normal breath sounds.  Abdominal:     General: Abdomen is flat.     Palpations: Abdomen is soft.     Tenderness: There is no abdominal tenderness.  Musculoskeletal:        General: No deformity.  Skin:    General: Skin is warm.     Findings: No rash.  Neurological:     General: No focal deficit present.     Mental Status: He is alert.  Psychiatric:        Mood and Affect: Mood normal.     ED Results / Procedures / Treatments   Labs (all labs ordered are listed, but only abnormal results are displayed) Labs Reviewed  RESP PANEL BY RT-PCR (RSV, FLU A&B, COVID)  RVPGX2 - Abnormal; Notable for the following components:      Result Value   Influenza B by PCR POSITIVE (*)  All other components within normal limits    EKG None  Radiology No results found.  Procedures Procedures  {Document cardiac monitor, telemetry assessment procedure when appropriate:1}  Medications Ordered in ED Medications  acetaminophen (TYLENOL) tablet 650 mg (650 mg Oral Given 05/31/22 0845)    ED Course/ Medical Decision Making/ A&P                           Medical Decision Making Risk OTC drugs.  This patient presents to the ED for sore throat, body aches, this involves an extensive number of treatment options, and is a complaint that carries with a high risk of complications and morbidity.  The differential diagnosis includes flu, COVID, RSV, strep, pharyngitis, bronchitis, pneumonia, infectious etiology.  This is not an exhaustive list.  Lab tests: I ordered and personally  interpreted labs.  The pertinent results include: Viral panel positive for flu B.  Imaging studies:  Problem list/ ED course/ Critical interventions/ Medical management: HPI: See above Vital signs within normal range and stable throughout visit. Laboratory/imaging studies significant for: See above. On physical examination, patient is afebrile and appears in no acute distress. This patient presents with symptoms suspicious for flu. Based on history and physical doubt sinusitis. COVID test was negative. Do not suspect underlying cardiopulmonary process. I considered, but think unlikely, pneumonia. Patient is nontoxic appearing and not in need of emergent medical intervention. Patient told to self isolate at home until symptoms subside for 72 hours.  Recommended patient to take TheraFlu or Mucinex for symptom relief.  Follow-up with primary care physician for further evaluation and management.  Return to the ER if new or worsening symptoms. I have reviewed the patient home medicines and have made adjustments as needed.  Cardiac monitoring/EKG: The patient was maintained on a cardiac monitor.  I personally reviewed and interpreted the cardiac monitor which showed an underlying rhythm of: sinus rhythm.  Additional history obtained: External records from outside source obtained and reviewed including: Chart review including previous notes, labs, imaging.  Consultations obtained:  Disposition Continued outpatient therapy. Follow-up with PCP recommended for reevaluation of symptoms. Treatment plan discussed with patient.  Pt acknowledged understanding was agreeable to the plan. Worrisome signs and symptoms were discussed with patient, and patient acknowledged understanding to return to the ED if they noticed these signs and symptoms. Patient was stable upon discharge.   This chart was dictated using voice recognition software.  Despite best efforts to proofread,  errors can occur which can change the  documentation meaning.     {Document critical care time when appropriate:1} {Document review of labs and clinical decision tools ie heart score, Chads2Vasc2 etc:1}  {Document your independent review of radiology images, and any outside records:1} {Document your discussion with family members, caretakers, and with consultants:1} {Document social determinants of health affecting pt's care:1} {Document your decision making why or why not admission, treatments were needed:1} Final Clinical Impression(s) / ED Diagnoses Final diagnoses:  Flu    Rx / DC Orders ED Discharge Orders     None

## 2022-05-31 NOTE — ED Notes (Signed)
Discharge paperwork given and verbally understood. 

## 2022-05-31 NOTE — Discharge Instructions (Addendum)
You tested positive for flu today.  You can try TheraFlu or Mucinex for symptom relief. I recommend close follow-up with PCP for reevaluation.  Please do not hesitate to return to emergency department if worrisome signs symptoms we discussed become apparent.

## 2022-05-31 NOTE — ED Triage Notes (Signed)
Thursday fever, chills, cough,achiness, fever highest 101.

## 2022-05-31 NOTE — ED Triage Notes (Signed)
2 am tylenol

## 2022-07-06 IMAGING — DX DG CHEST 1V PORT
2 series · 2 of 2 positions shown · non-contrast
Comparison: Prior chest radiographs 10/13/2016 and earlier.

CLINICAL DATA: Provided history: Cough. Additional provided:
Headache x4 days, diarrhea x1 week, negative COVID test 02/13/2020.

EXAM:
PORTABLE CHEST 1 VIEW

[chest ap (1 of 2)]
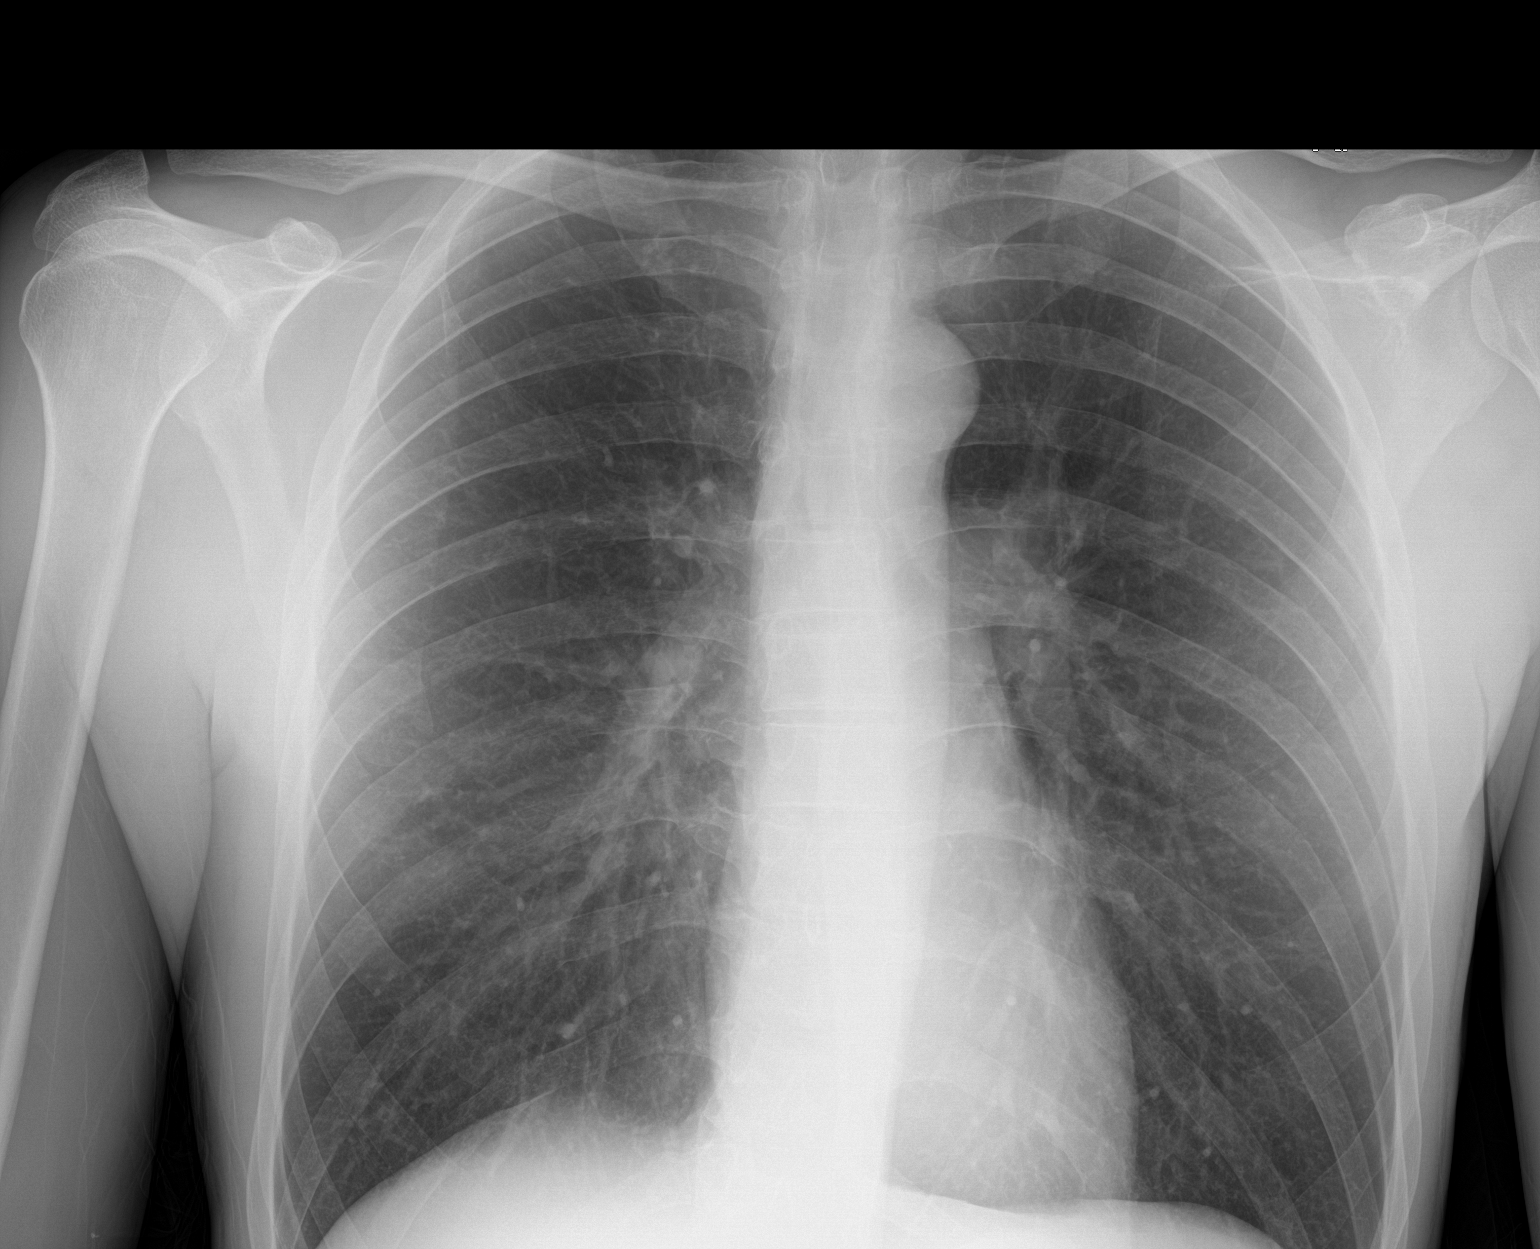

[chest ap (2 of 2)]
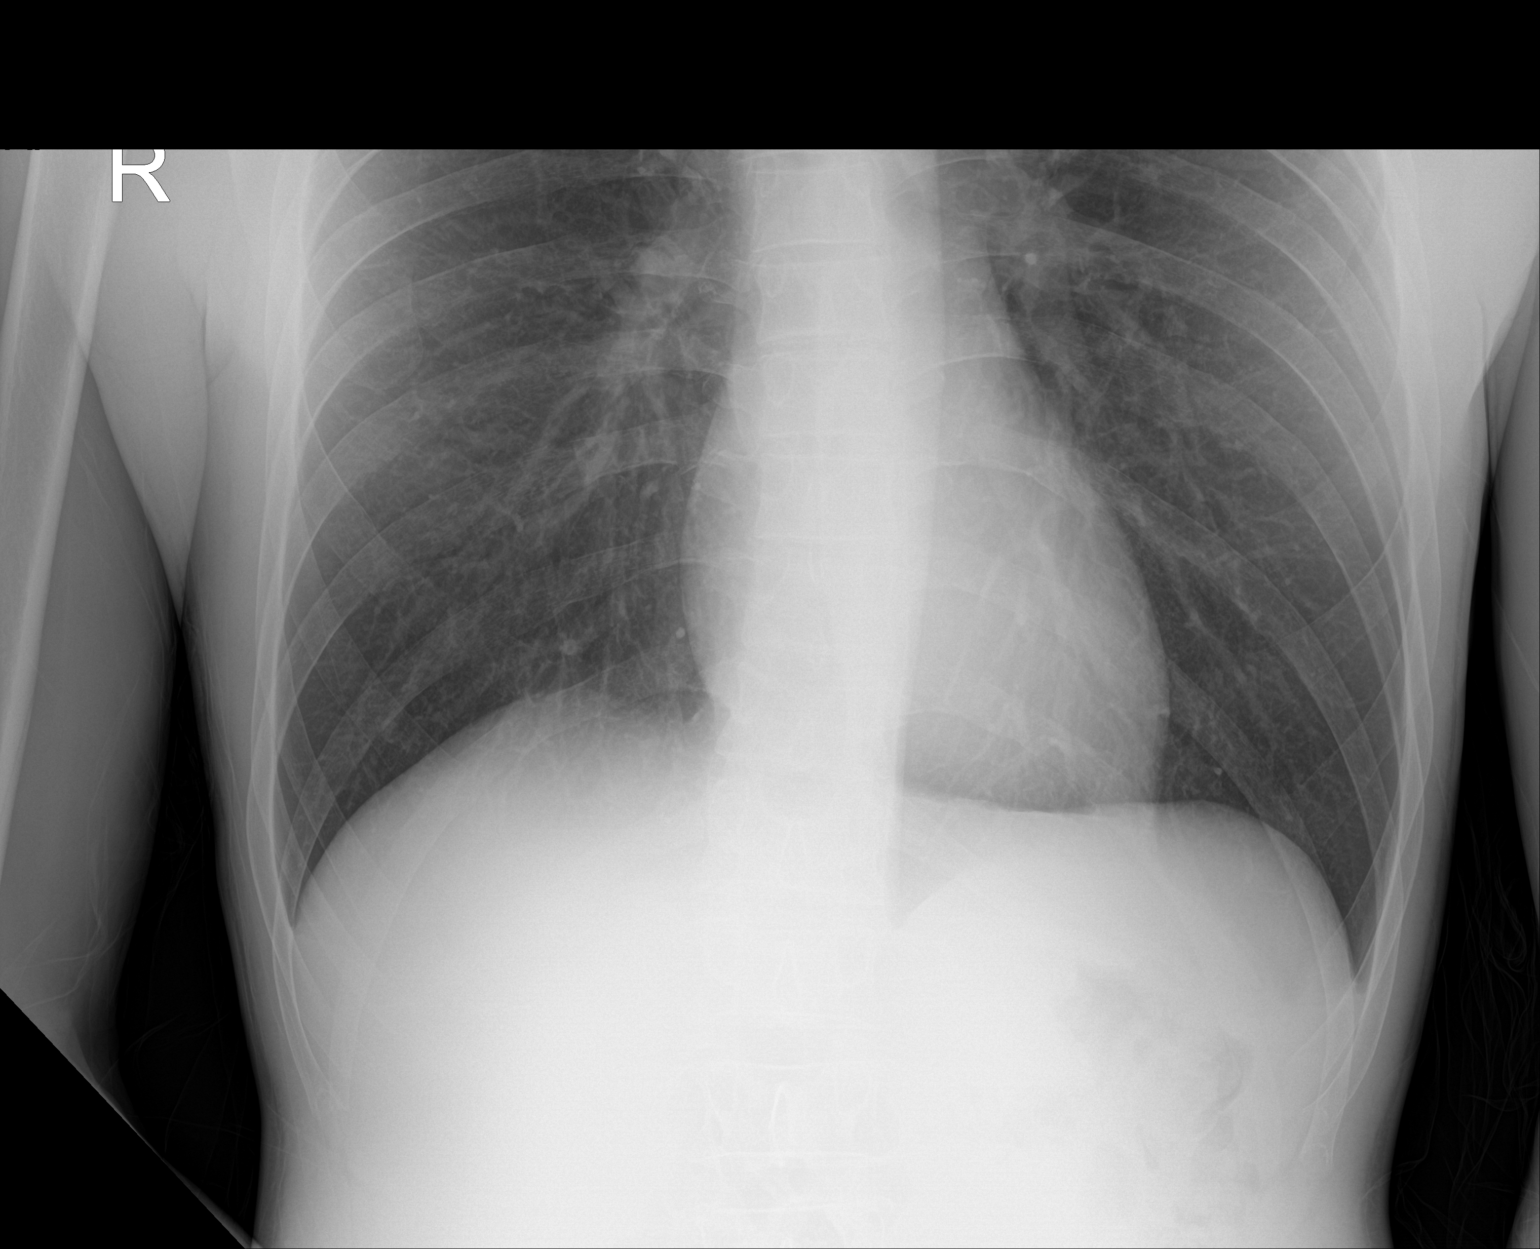

[2 of 2 positions shown; findings below may reference images not displayed]

FINDINGS: Heart size within normal limits.

There is no appreciable airspace consolidation.

No evidence of pleural effusion or pneumothorax.

No acute bony abnormality identified. Subtle thoracic levocurvature.
IMPRESSION: No evidence of active cardiopulmonary disease.

## 2023-03-09 ENCOUNTER — Other Ambulatory Visit: Payer: Self-pay

## 2023-03-09 ENCOUNTER — Encounter (HOSPITAL_BASED_OUTPATIENT_CLINIC_OR_DEPARTMENT_OTHER): Payer: Self-pay | Admitting: Emergency Medicine

## 2023-03-09 ENCOUNTER — Emergency Department (HOSPITAL_BASED_OUTPATIENT_CLINIC_OR_DEPARTMENT_OTHER)
Admission: EM | Admit: 2023-03-09 | Discharge: 2023-03-09 | Disposition: A | Discharge status: Home / Self Care | Payer: BC Managed Care – PPO | Attending: Emergency Medicine | Admitting: Emergency Medicine

## 2023-03-09 DIAGNOSIS — R197 Diarrhea, unspecified: Secondary | ICD-10-CM | POA: Diagnosis present

## 2023-03-09 LAB — COMPREHENSIVE METABOLIC PANEL
ALT: 18 U/L (ref 0–44)
AST: 25 U/L (ref 15–41)
Albumin: 4.6 g/dL (ref 3.5–5.0)
Alkaline Phosphatase: 90 U/L (ref 38–126)
Anion gap: 8 (ref 5–15)
BUN: 10 mg/dL (ref 6–20)
CO2: 29 mmol/L (ref 22–32)
Calcium: 9.8 mg/dL (ref 8.9–10.3)
Chloride: 102 mmol/L (ref 98–111)
Creatinine, Ser: 1.01 mg/dL (ref 0.61–1.24)
GFR, Estimated: >60 mL/min (ref >60)
Glucose, Bld: 95 mg/dL (ref 70–99)
Potassium: 4.4 mmol/L (ref 3.5–5.1)
Sodium: 139 mmol/L (ref 135–145)
Total Bilirubin: 0.6 mg/dL (ref 0.3–1.2)
Total Protein: 7.1 g/dL (ref 6.5–8.1)

## 2023-03-09 LAB — CBC
HCT: 42.9 % (ref 39.0–52.0)
Hemoglobin: 14.5 g/dL (ref 13.0–17.0)
MCH: 32.2 pg (ref 26.0–34.0)
MCHC: 33.8 g/dL (ref 30.0–36.0)
MCV: 95.1 fL (ref 80.0–100.0)
Platelets: 298 10*3/uL (ref 150–400)
RBC: 4.51 MIL/uL (ref 4.22–5.81)
RDW: 13.2 % (ref 11.5–15.5)
WBC: 8.2 10*3/uL (ref 4.0–10.5)
nRBC: 0 % (ref 0.0–0.2)

## 2023-03-09 LAB — URINALYSIS, ROUTINE W REFLEX MICROSCOPIC
Bilirubin Urine: NEGATIVE
Glucose, UA: NEGATIVE mg/dL
Hgb urine dipstick: NEGATIVE
Ketones, ur: NEGATIVE mg/dL
Leukocytes,Ua: NEGATIVE
Nitrite: NEGATIVE
Protein, ur: NEGATIVE mg/dL
Specific Gravity, Urine: 1.017 (ref 1.005–1.030)
pH: 5.5 (ref 5.0–8.0)

## 2023-03-09 LAB — LIPASE, BLOOD: Lipase: 22 U/L (ref 11–51)

## 2023-03-09 MED ORDER — LOPERAMIDE HCL 2 MG PO CAPS: 2.0000 mg | ORAL_CAPSULE | Freq: Three times a day (TID) | ORAL | 0 refills | Status: AC | PRN

## 2023-03-09 MED ORDER — ONDANSETRON 4 MG PO TBDP: 4.0000 mg | ORAL_TABLET | Freq: Three times a day (TID) | ORAL | 0 refills | Status: AC | PRN

## 2023-03-09 NOTE — ED Provider Notes (Signed)
[place] EMERGENCY DEPARTMENT AT [facility] Provider Note   CSN: [id number] Arrival date & time: 03/09/23  1514     History  Chief Complaint  Patient presents with   Abdominal Pain   Diarrhea    Carlos Hatfield is a 42 y.o. male.   Abdominal Pain Associated symptoms: diarrhea   Diarrhea Associated symptoms: abdominal pain   Patient with diarrhea.  Some cramping.  Has had since Saturday with today being Tuesday.  Reportedly has not really eaten much for the last 2 days because he does not want to have diarrhea.  No definite sick contacts.  Patient thinks it could be food poisoning.  Had some nausea initially but that is improved.  States he feels somewhat weak.    History reviewed. No pertinent past medical history.  Home Medications Prior to Admission medications   Medication Sig Start Date End Date Taking? Authorizing Provider  loperamide (IMODIUM) 2 MG capsule Take 1 capsule (2 mg total) by mouth 3 (three) times daily as needed for diarrhea or loose stools. 03/09/23  Yes Benjiman Core, MD  ondansetron (ZOFRAN-ODT) 4 MG disintegrating tablet Take 1 tablet (4 mg total) by mouth every 8 (eight) hours as needed. 03/09/23  Yes Benjiman Core, MD  albuterol (PROVENTIL HFA;VENTOLIN HFA) 108 (90 BASE) MCG/ACT inhaler Inhale 1-2 puffs into the lungs every 6 (six) hours as needed for wheezing or shortness of breath. 09/21/14   Elson Areas, PA-C  benzonatate (TESSALON) 100 MG capsule Take 1 capsule (100 mg total) by mouth every 8 (eight) hours. 05/31/22   Jeanelle Malling, PA  Rivaroxaban 15 & 20 MG TBPK Follow package directions: Take one 15mg  tablet by mouth twice a day. On day 22, switch to one 20mg  tablet once a day. Take with food. 06/13/19   Mesner, Barbara Cower, MD      Allergies    Patient has no known allergies.    Review of Systems   Review of Systems  Gastrointestinal:  Positive for abdominal pain and diarrhea.    Physical Exam Updated Vital Signs BP 123/79    Pulse 66   Temp 98.4 F (36.9 C)   Resp 16   Ht 6\' 4"  (1.93 m)   Wt 76.2 kg   SpO2 100%   BMI 20.45 kg/m  Physical Exam Vitals and nursing note reviewed.  Cardiovascular:     Rate and Rhythm: Regular rhythm.  Abdominal:     Tenderness: There is no abdominal tenderness.     Hernia: No hernia is present.  Skin:    Capillary Refill: Capillary refill takes less than 2 seconds.  Neurological:     Mental Status: He is alert.     ED Results / Procedures / Treatments   Labs (all labs ordered are listed, but only abnormal results are displayed) Labs Reviewed  LIPASE, BLOOD  COMPREHENSIVE METABOLIC PANEL  CBC  URINALYSIS, ROUTINE W REFLEX MICROSCOPIC    EKG None  Radiology No results found.  Procedures Procedures    Medications Ordered in ED Medications - No data to display  ED Course/ Medical Decision Making/ A&P                                 Medical Decision Making Amount and/or Complexity of Data Reviewed Labs: ordered.  Risk Prescription drug management.   Patient with some nausea and diarrhea.  Mild abdominal cramping.  Differential diagnose includes colitis, gastroenteritis.  Benign  abdominal exam.  Is tolerating some orals.  With the national IV fluid shortage will attempt oral hydration.  Will get basic blood work.  Has tolerated oral hydration.  Blood work reassuring.  Does not appear liver dehydrated.  I think stable for discharge home with antiemetics.  Doubt severe intra-abdominal pathology.  Potentially is food poisoning        Final Clinical Impression(s) / ED Diagnoses Final diagnoses:  Diarrhea, unspecified type    Rx / DC Orders ED Discharge Orders          Ordered    ondansetron (ZOFRAN-ODT) 4 MG disintegrating tablet  Every 8 hours PRN        03/09/23 1633    loperamide (IMODIUM) 2 MG capsule  3 times daily PRN        03/09/23 1633              Benjiman Core, MD 03/09/23 2226

## 2023-03-09 NOTE — ED Triage Notes (Signed)
Cramping and diarrhea since Saturday "I think its food poisoning"

## 2023-03-09 NOTE — ED Notes (Signed)
Pt was able to keep down water/crackers provided.

## 2023-03-09 NOTE — ED Notes (Signed)
Pt given water and crackers for PO challenge

## 2023-05-07 ENCOUNTER — Encounter (HOSPITAL_BASED_OUTPATIENT_CLINIC_OR_DEPARTMENT_OTHER): Payer: Self-pay | Admitting: Emergency Medicine

## 2023-05-07 ENCOUNTER — Other Ambulatory Visit: Payer: Self-pay

## 2023-05-07 ENCOUNTER — Emergency Department (HOSPITAL_BASED_OUTPATIENT_CLINIC_OR_DEPARTMENT_OTHER)
Admission: EM | Admit: 2023-05-07 | Discharge: 2023-05-07 | Disposition: A | Discharge status: Home / Self Care | Payer: BC Managed Care – PPO | Attending: Emergency Medicine | Admitting: Emergency Medicine

## 2023-05-07 ENCOUNTER — Emergency Department (HOSPITAL_BASED_OUTPATIENT_CLINIC_OR_DEPARTMENT_OTHER): Payer: BC Managed Care – PPO

## 2023-05-07 DIAGNOSIS — Y990 Civilian activity done for income or pay: Secondary | ICD-10-CM | POA: Insufficient documentation

## 2023-05-07 DIAGNOSIS — Z23 Encounter for immunization: Secondary | ICD-10-CM | POA: Insufficient documentation

## 2023-05-07 DIAGNOSIS — W231XXA Caught, crushed, jammed, or pinched between stationary objects, initial encounter: Secondary | ICD-10-CM | POA: Diagnosis not present

## 2023-05-07 DIAGNOSIS — S6992XA Unspecified injury of left wrist, hand and finger(s), initial encounter: Secondary | ICD-10-CM | POA: Diagnosis present

## 2023-05-07 DIAGNOSIS — Z7901 Long term (current) use of anticoagulants: Secondary | ICD-10-CM | POA: Diagnosis not present

## 2023-05-07 DIAGNOSIS — S61012A Laceration without foreign body of left thumb without damage to nail, initial encounter: Secondary | ICD-10-CM | POA: Diagnosis not present

## 2023-05-07 MED ORDER — LIDOCAINE HCL (PF) 1 % IJ SOLN
5.0000 mL | Freq: Once | INTRAMUSCULAR | Status: AC
  Administered 2023-05-07: 5 mL
  Filled 2023-05-07: qty 5

## 2023-05-07 MED ORDER — TETANUS-DIPHTH-ACELL PERTUSSIS 5-2.5-18.5 LF-MCG/0.5 IM SUSY
0.5000 mL | PREFILLED_SYRINGE | Freq: Once | INTRAMUSCULAR | Status: AC
  Administered 2023-05-07: 0.5 mL via INTRAMUSCULAR
  Filled 2023-05-07: qty 0.5

## 2023-05-07 NOTE — ED Notes (Signed)
Pt finger bandaged and wrapped.

## 2023-05-07 NOTE — ED Provider Notes (Signed)
[place] EMERGENCY DEPARTMENT AT [facility] Provider Note   CSN: [id number] Arrival date & time: 05/07/23  6962     History {Add pertinent medical, surgical, social history, OB history to HPI:1} Chief Complaint  Patient presents with   Finger Injury    Carlos Hatfield is a 42 y.o. male.  HPI Patient presents with injury to left thumb.  Was caused reportedly by machine at work.  Unknown last tetanus.  Has a flap avulsion on the dorsal lateral aspect of his left thumb over the IP joint.  No other injury.   History reviewed. No pertinent past medical history.  Home Medications Prior to Admission medications   Medication Sig Start Date End Date Taking? Authorizing Provider  albuterol (PROVENTIL HFA;VENTOLIN HFA) 108 (90 BASE) MCG/ACT inhaler Inhale 1-2 puffs into the lungs every 6 (six) hours as needed for wheezing or shortness of breath. 09/21/14   Elson Areas, PA-C  benzonatate (TESSALON) 100 MG capsule Take 1 capsule (100 mg total) by mouth every 8 (eight) hours. 05/31/22   Jeanelle Malling, PA  loperamide (IMODIUM) 2 MG capsule Take 1 capsule (2 mg total) by mouth 3 (three) times daily as needed for diarrhea or loose stools. 03/09/23   Benjiman Core, MD  ondansetron (ZOFRAN-ODT) 4 MG disintegrating tablet Take 1 tablet (4 mg total) by mouth every 8 (eight) hours as needed. 03/09/23   Benjiman Core, MD  Rivaroxaban 15 & 20 MG TBPK Follow package directions: Take one 15mg  tablet by mouth twice a day. On day 22, switch to one 20mg  tablet once a day. Take with food. 06/13/19   Mesner, Barbara Cower, MD      Allergies    Patient has no known allergies.    Review of Systems   Review of Systems  Physical Exam Updated Vital Signs BP 109/72 (BP Location: Right Arm)   Pulse 61   Temp 98.4 F (36.9 C) (Oral)   Resp 20   Ht 6\' 4"  (1.93 m)   Wt 78 kg   SpO2 99%   BMI 20.94 kg/m  Physical Exam Vitals reviewed.  Musculoskeletal:     Comments: Laceration/avulsion of a flap on  the dorsal lateral aspect of his left thumb over the IP joint.  Approximately 2 cm long.  Joint appears stable.  Sensation decreased distally over the finger.     ED Results / Procedures / Treatments   Labs (all labs ordered are listed, but only abnormal results are displayed) Labs Reviewed - No data to display  EKG None  Radiology No results found.  Procedures Procedures  {Document cardiac monitor, telemetry assessment procedure when appropriate:1}  Medications Ordered in ED Medications  lidocaine (PF) (XYLOCAINE) 1 % injection 5 mL (has no administration in time range)  Tdap (BOOSTRIX) injection 0.5 mL (has no administration in time range)    ED Course/ Medical Decision Making/ A&P   {   Click here for ABCD2, HEART and other calculatorsREFRESH Note before signing :1}                              Medical Decision Making Amount and/or Complexity of Data Reviewed Radiology: ordered.  Risk Prescription drug management.   Patient with laceration to thumb.  Tenderness with decreased sensation.  Will update tetanus.  Will get x-ray.  Will anesthetized with digital block and get further evaluation.   {Document critical care time when appropriate:1} {Document review of labs and clinical  decision tools ie heart score, Chads2Vasc2 etc:1}  {Document your independent review of radiology images, and any outside records:1} {Document your discussion with family members, caretakers, and with consultants:1} {Document social determinants of health affecting pt's care:1} {Document your decision making why or why not admission, treatments were needed:1} Final Clinical Impression(s) / ED Diagnoses Final diagnoses:  None    Rx / DC Orders ED Discharge Orders     None

## 2023-05-07 NOTE — ED Triage Notes (Signed)
Pt caox4, ambulatory from work c/o L thumb injury described as avulsion from metal machine at work. No active bleeding. Denies any other injury. Does not know when he last had a tetanus vaccine.

## 2023-05-07 NOTE — Discharge Instructions (Signed)
The stitches can come out in about a week. ?
